# Patient Record
Sex: Female | Born: 1960 | Race: White | Hispanic: No | Marital: Single | Smoking: Current every day smoker
Health system: Southern US, Community
[De-identification: ages and names within clinical notes are randomized; demographics above are authoritative.]

## PROBLEM LIST (undated history)

## (undated) DIAGNOSIS — K5792 Diverticulitis of intestine, part unspecified, without perforation or abscess without bleeding: Secondary | ICD-10-CM

---

## 2008-07-29 ENCOUNTER — Emergency Department (HOSPITAL_COMMUNITY): Admission: EM | Admit: 2008-07-29 | Discharge: 2008-07-29 | Payer: Self-pay | Admitting: Emergency Medicine

## 2012-05-31 ENCOUNTER — Emergency Department: Payer: Self-pay | Admitting: Emergency Medicine

## 2012-05-31 LAB — CBC
HCT: 51.4 % — ABNORMAL HIGH
HGB: 16.6 g/dL — ABNORMAL HIGH
MCH: 27.5 pg
MCHC: 32.3 g/dL
MCV: 85 fL
Platelet: 479 x10 3/mm 3 — ABNORMAL HIGH
RBC: 6.03 X10 6/mm 3 — ABNORMAL HIGH
RDW: 14.8 % — ABNORMAL HIGH
WBC: 23.6 x10 3/mm 3 — ABNORMAL HIGH

## 2012-05-31 LAB — URINALYSIS, COMPLETE
Bilirubin,UR: NEGATIVE
Glucose,UR: NEGATIVE mg/dL
Nitrite: NEGATIVE
Ph: 5
Protein: 30
RBC,UR: 3 /HPF
Specific Gravity: 1.02
Squamous Epithelial: 22
WBC UR: 19 /HPF

## 2012-05-31 LAB — COMPREHENSIVE METABOLIC PANEL
Alkaline Phosphatase: 112 U/L (ref 50–136)
BUN: 9 mg/dL (ref 7–18)
Bilirubin,Total: 0.7 mg/dL (ref 0.2–1.0)
Calcium, Total: 9.2 mg/dL (ref 8.5–10.1)
Chloride: 106 mmol/L (ref 98–107)
EGFR (Non-African Amer.): >60
Potassium: 4.1 mmol/L (ref 3.5–5.1)
SGOT(AST): 20 U/L (ref 15–37)

## 2012-05-31 LAB — CK TOTAL AND CKMB (NOT AT ARMC)
CK, Total: 38 U/L
CK-MB: 0.7 ng/mL

## 2012-11-15 ENCOUNTER — Encounter (HOSPITAL_COMMUNITY): Payer: Self-pay | Admitting: *Deleted

## 2012-11-15 ENCOUNTER — Emergency Department (HOSPITAL_COMMUNITY): Payer: Self-pay

## 2012-11-15 ENCOUNTER — Emergency Department (HOSPITAL_COMMUNITY)
Admission: EM | Admit: 2012-11-15 | Discharge: 2012-11-15 | Disposition: A | Discharge status: Home / Self Care | Payer: Self-pay | Attending: Emergency Medicine | Admitting: Emergency Medicine

## 2012-11-15 DIAGNOSIS — R42 Dizziness and giddiness: Secondary | ICD-10-CM | POA: Insufficient documentation

## 2012-11-15 DIAGNOSIS — R112 Nausea with vomiting, unspecified: Secondary | ICD-10-CM | POA: Insufficient documentation

## 2012-11-15 DIAGNOSIS — F17200 Nicotine dependence, unspecified, uncomplicated: Secondary | ICD-10-CM | POA: Insufficient documentation

## 2012-11-15 DIAGNOSIS — R269 Unspecified abnormalities of gait and mobility: Secondary | ICD-10-CM | POA: Insufficient documentation

## 2012-11-15 HISTORY — DX: Diverticulitis of intestine, part unspecified, without perforation or abscess without bleeding: K57.92

## 2012-11-15 MED ORDER — LORAZEPAM 1 MG PO TABS: 0.5000 mg | ORAL_TABLET | Freq: Three times a day (TID) | ORAL | Status: DC | PRN

## 2012-11-15 MED ORDER — LORAZEPAM 1 MG PO TABS
1.0000 mg | ORAL_TABLET | Freq: Once | ORAL | Status: AC
  Administered 2012-11-15: 1 mg via ORAL
  Filled 2012-11-15: qty 1

## 2012-11-15 MED ORDER — MECLIZINE HCL 50 MG PO TABS: 25.0000 mg | ORAL_TABLET | Freq: Three times a day (TID) | ORAL | Status: DC | PRN

## 2012-11-15 NOTE — ED Notes (Signed)
Dizzy since Saturday with vomiting,  Nausea continues.  Feels better when lies down,No hx of head injury.

## 2012-11-15 NOTE — ED Provider Notes (Signed)
History   This chart was scribed for Teresa Hutching, MD by Sofie Rower, ED Scribe. The patient was seen in room APA07/APA07 and the patient's care was started at 4:50PM.     CSN: [id number]  Arrival date & time 11/15/12  1339   First MD Initiated Contact with Patient 11/15/12 1650      Chief Complaint  Patient presents with  . Dizziness    (Consider location/radiation/quality/duration/timing/severity/associated sxs/prior treatment) The history is provided by the patient and a friend. No language interpreter was used.    Teresa Glass is a 51 y.o. female , with a hx of diverticulitis, who presents to the Emergency Department complaining of sudden, progressively worsening dizziness, onset two days ago (11/13/12).  Associated symptoms include ataxic gait, nausea, and vomiting. The pt reports she has been dizzy and feeling as if she is intoxicated for the past two days, although she has been drinking plenty of fluids and staying hydrated. The pt has taken 2 doses of meclozine (one hour ago) which provides moderate relief of the dizziness. Modifying factors include certain movements and positions, specifically transition from the seated to standing position, which intensifies the dizziness.  The pt denies otalgia and sore throat.   The pt is a current everyday smoker, in addition to drinking alcohol.   Past Medical History  Diagnosis Date  . Diverticulitis     History reviewed. No pertinent past surgical history.  History reviewed. No pertinent family history.  History  Substance Use Topics  . Smoking status: Current Every Day Smoker  . Smokeless tobacco: Not on file  . Alcohol Use: Yes    OB History    Grav Para Term Preterm Abortions TAB SAB Ect Mult Living                  Review of Systems  HENT: Negative for ear pain and sore throat.   Gastrointestinal: Positive for nausea and vomiting.  Neurological: Positive for dizziness.  All other systems reviewed and are  negative.    Allergies  Review of patient's allergies indicates no known allergies.  Home Medications   Current Outpatient Rx  Name  Route  Sig  Dispense  Refill  . ACETAMINOPHEN 500 MG PO TABS   Oral   Take 500 mg by mouth every 6 (six) hours as needed. For pain         . IBUPROFEN 200 MG PO TABS   Oral   Take 200 mg by mouth every 6 (six) hours as needed. For pain           BP 128/94  Pulse 88  Temp 98.5 F (36.9 C) (Oral)  Resp 20  Ht 5\' 7"  (1.702 m)  Wt 160 lb (72.576 kg)  BMI 25.06 kg/m2  SpO2 97%  Physical Exam  Nursing note and vitals reviewed. Constitutional: She is oriented to person, place, and time. She appears well-developed and well-nourished.  HENT:  Head: Normocephalic and atraumatic.  Eyes: Conjunctivae normal and EOM are normal. Pupils are equal, round, and reactive to light.  Neck: Normal range of motion. Neck supple.  Cardiovascular: Normal rate, regular rhythm and normal heart sounds.   Pulmonary/Chest: Effort normal and breath sounds normal.  Abdominal: Soft. Bowel sounds are normal.  Musculoskeletal: Normal range of motion.  Neurological: She is alert and oriented to person, place, and time.  Skin: Skin is warm and dry.  Psychiatric: She has a normal mood and affect.    ED Course  Procedures (  including critical care time)  DIAGNOSTIC STUDIES: Oxygen Saturation is 97% on room air, normal by my interpretation.    COORDINATION OF CARE:  5:06 PM- Treatment plan concerning elimination of possible stroke, possibility of vertigo, and CT scan discussed with patient. Pt agrees with treatment.   8:50 PM- Recheck. Pt ambulated without gross ataxia. Treatment plan concerning discharge with meclozine and ativan discussed with patient. Pt agrees with treatment.        Labs Reviewed - No data to display  No results found for this or any previous visit. Ct Head Wo Contrast  11/15/2012  *RADIOLOGY REPORT*  Clinical Data: Dizziness  CT HEAD  WITHOUT CONTRAST  Technique:  Contiguous axial images were obtained from the base of the skull through the vertex without contrast.  Comparison: None  Findings: The brain has a normal appearance without evidence for hemorrhage, infarction, hydrocephalus, or mass lesion.  There is no extra axial fluid collection.  The skull and paranasal sinuses are normal.  IMPRESSION: No acute intracranial abnormalities.   Original Report Authenticated By: Signa Kell, M.D.       No diagnosis found.    MDM  History and physical consistent with vertigo.  Patient was ambulatory at discharge.  Discharge home on meclizine 25 mg #15 and Ativan 0.5 mg #10. She has been instructed return if worse      I personally performed the services described in this documentation, which was scribed in my presence. The recorded information has been reviewed and is accurate.    Teresa Hutching, MD 11/15/12 2100

## 2012-11-15 NOTE — ED Notes (Signed)
Walked patient short distance. The patient walked with an unsteady gate and complained of being dizzy.

## 2013-07-09 IMAGING — CR DG CHEST 2V
1 series · 2 of 2 positions shown · non-contrast
Comparison: none

REASON FOR EXAM: leukocytosis
COMMENTS:

PROCEDURE:     DXR - DXR CHEST PA (OR AP) AND LATERAL  - May 31, 2012  [DATE]
RESULT:     Lungs clear. Cardiovascular structures unremarkable.

[Series 1: pa · 0.17mm/px · 2 of 2 slices shown]
[im 1/2]
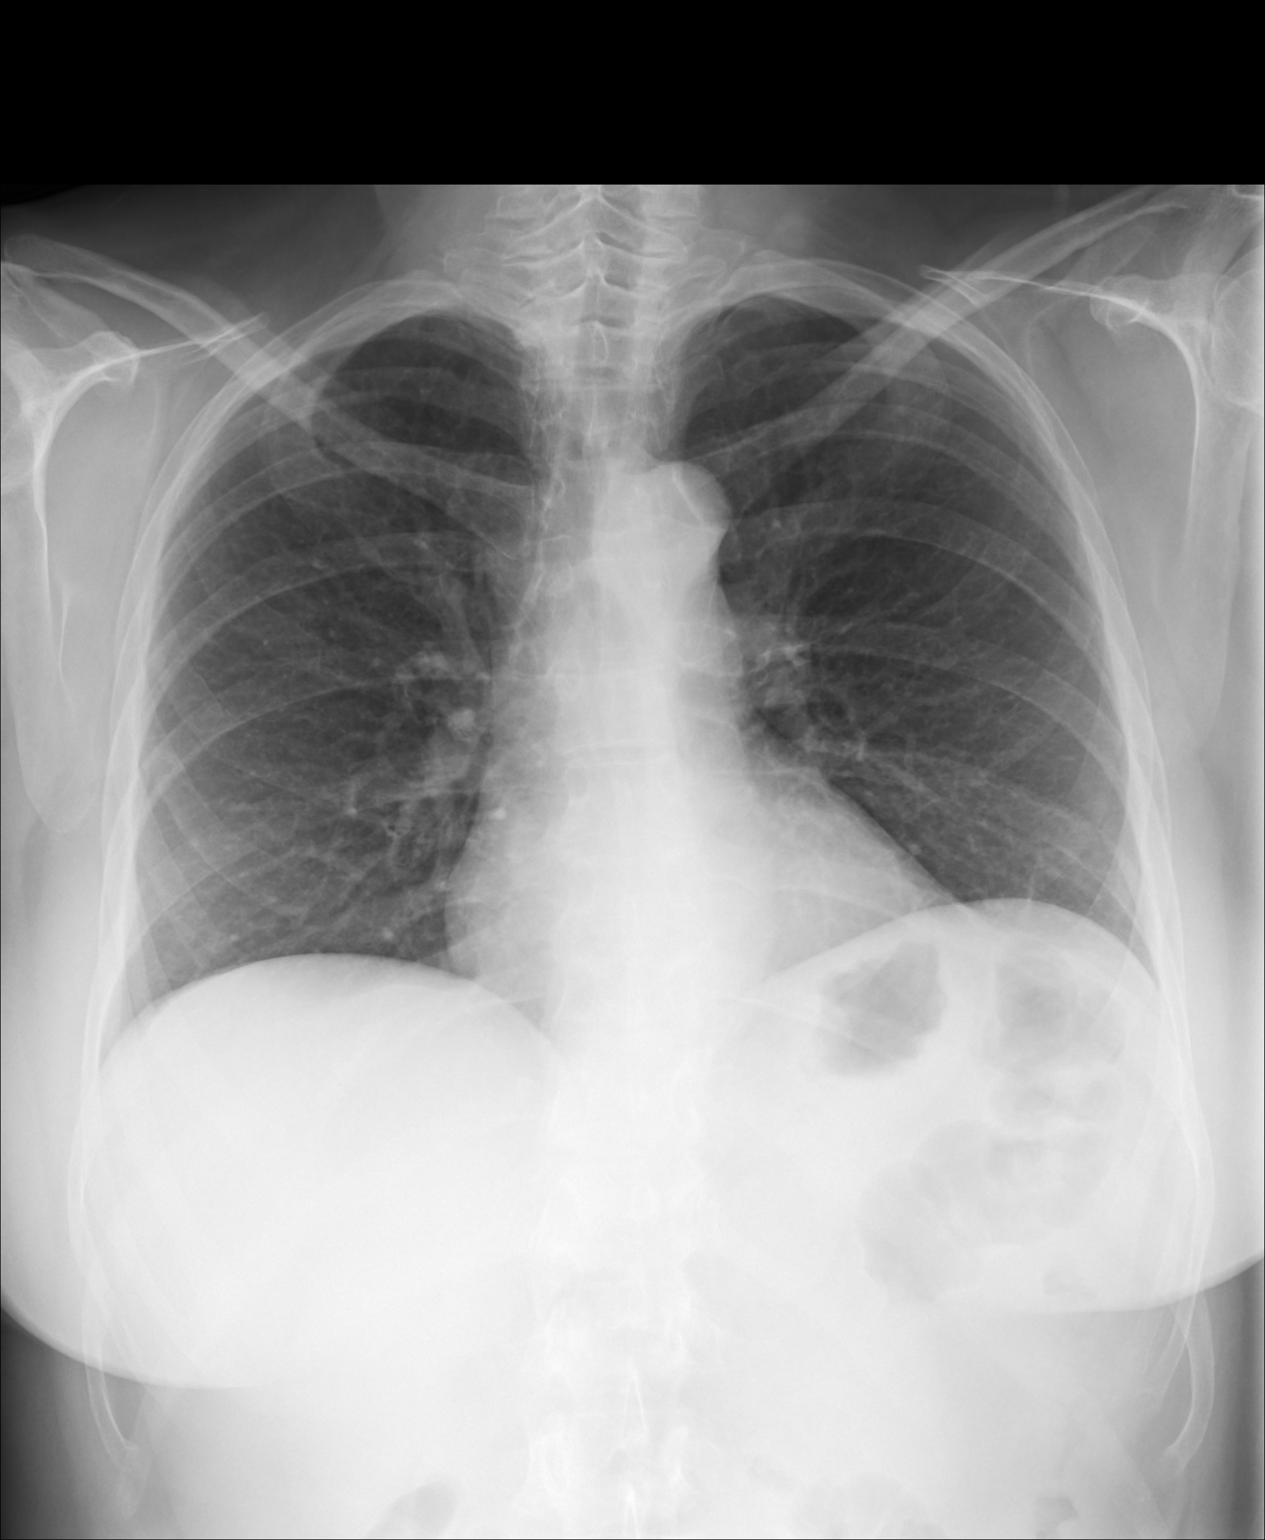
[im 2/2]
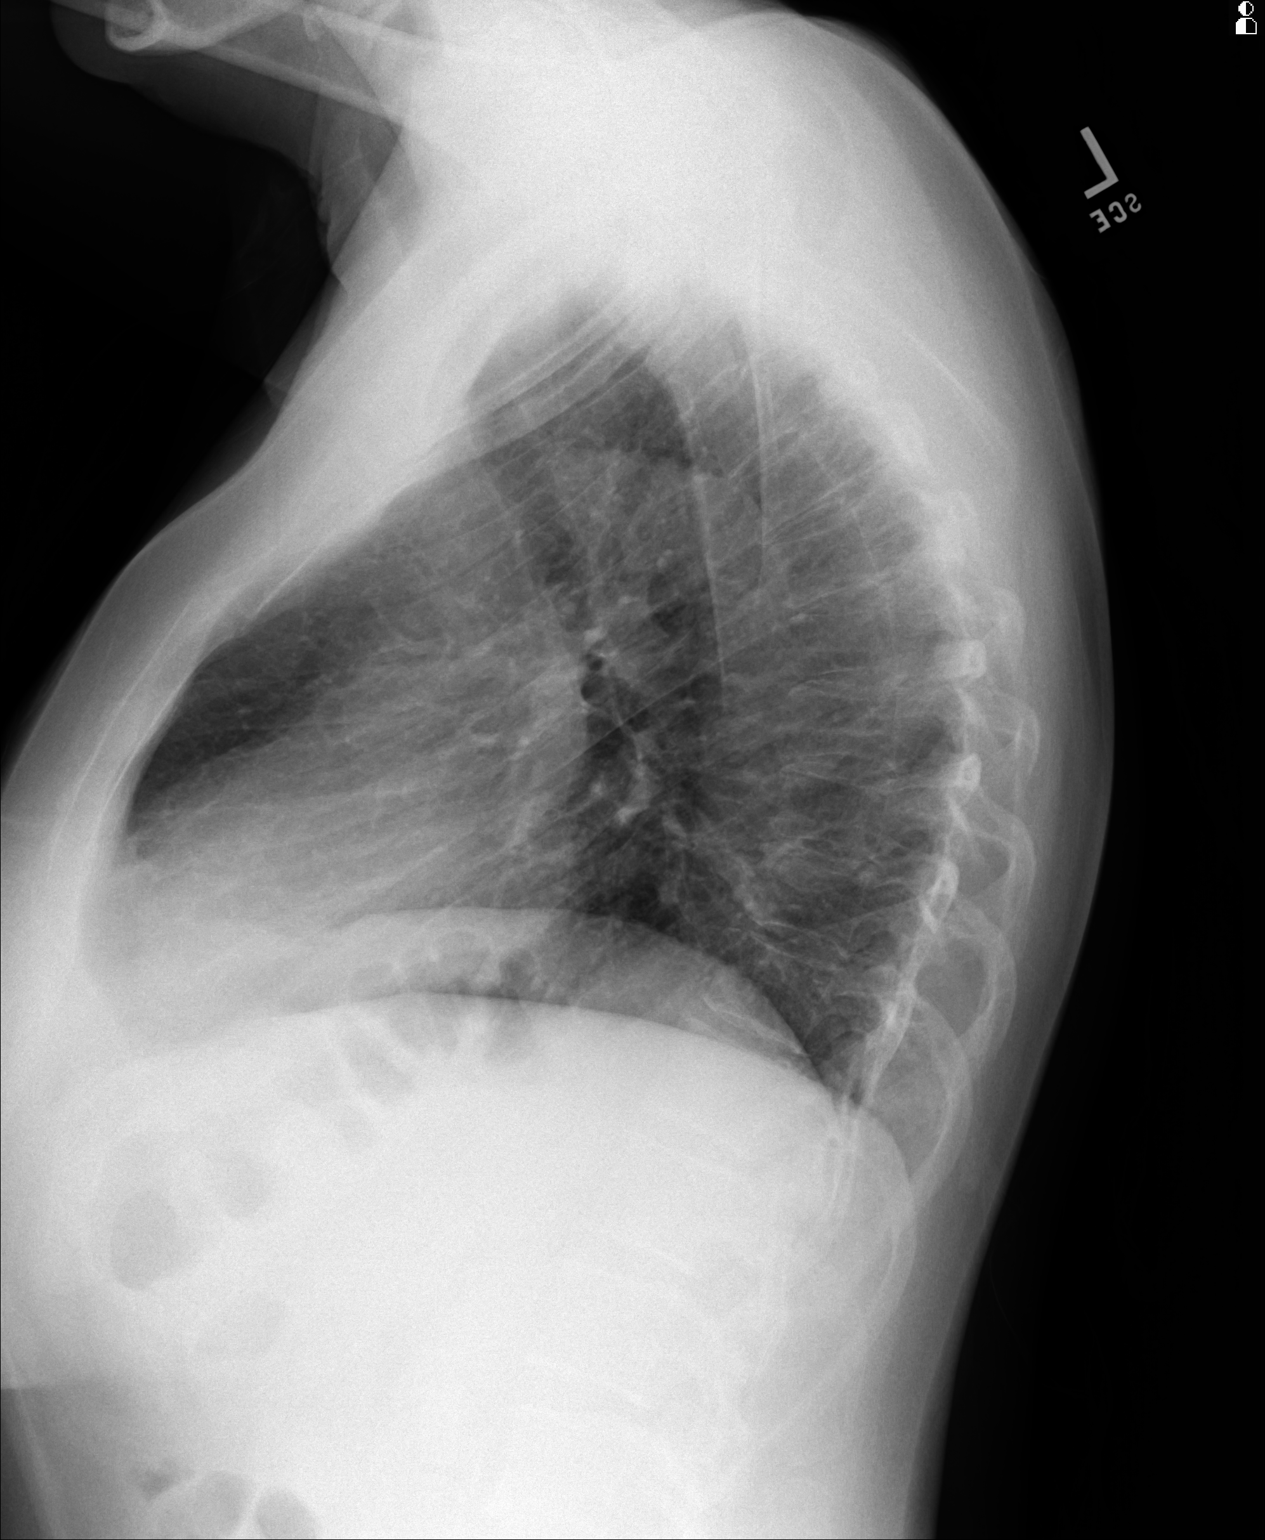

[2 of 2 positions shown; findings below may reference images not displayed]

IMPRESSION: No acute abnormality.

## 2015-08-24 ENCOUNTER — Emergency Department (HOSPITAL_COMMUNITY)
Admission: EM | Admit: 2015-08-24 | Discharge: 2015-08-24 | Disposition: A | Discharge status: Home / Self Care | Payer: Self-pay | Attending: Emergency Medicine | Admitting: Emergency Medicine

## 2015-08-24 ENCOUNTER — Encounter (HOSPITAL_COMMUNITY): Payer: Self-pay | Admitting: Emergency Medicine

## 2015-08-24 ENCOUNTER — Emergency Department (HOSPITAL_COMMUNITY): Payer: Self-pay

## 2015-08-24 DIAGNOSIS — Y9389 Activity, other specified: Secondary | ICD-10-CM | POA: Insufficient documentation

## 2015-08-24 DIAGNOSIS — S5012XA Contusion of left forearm, initial encounter: Secondary | ICD-10-CM | POA: Insufficient documentation

## 2015-08-24 DIAGNOSIS — Y998 Other external cause status: Secondary | ICD-10-CM | POA: Insufficient documentation

## 2015-08-24 DIAGNOSIS — Y9289 Other specified places as the place of occurrence of the external cause: Secondary | ICD-10-CM | POA: Insufficient documentation

## 2015-08-24 DIAGNOSIS — Z720 Tobacco use: Secondary | ICD-10-CM | POA: Insufficient documentation

## 2015-08-24 DIAGNOSIS — W010XXA Fall on same level from slipping, tripping and stumbling without subsequent striking against object, initial encounter: Secondary | ICD-10-CM | POA: Insufficient documentation

## 2015-08-24 DIAGNOSIS — Z8719 Personal history of other diseases of the digestive system: Secondary | ICD-10-CM | POA: Insufficient documentation

## 2015-08-24 MED ORDER — KETOROLAC TROMETHAMINE 10 MG PO TABS
10.0000 mg | ORAL_TABLET | Freq: Once | ORAL | Status: AC
  Administered 2015-08-24: 10 mg via ORAL
  Filled 2015-08-24: qty 1

## 2015-08-24 MED ORDER — ACETAMINOPHEN 500 MG PO TABS
500.0000 mg | ORAL_TABLET | Freq: Once | ORAL | Status: AC
  Administered 2015-08-24: 500 mg via ORAL
  Filled 2015-08-24: qty 1

## 2015-08-24 NOTE — Discharge Instructions (Signed)
Your x-ray is negative for fracture or dislocation. Please use the Ace wrap, sling, an ice pack until the soreness has subsided. Please use 800 mg of ibuprofen, and 500 mg of Tylenol 3 times daily with a meal for soreness and discomfort. Please see the orthopedist listed above, or the orthopedist of your choice if not improving.

## 2015-08-24 NOTE — ED Provider Notes (Signed)
CSN: [id number]     Arrival date & time 08/24/15  1642 History   First MD Initiated Contact with Patient 08/24/15 1939     Chief Complaint  Patient presents with  . Arm Pain     (Consider location/radiation/quality/duration/timing/severity/associated sxs/prior Treatment) HPI Comments: Patient is a 54 year old female who presents to the emergency department with a complaint of left arm and forearm area pain.  The patient states that she slipped and fell on last evening injuring the left arm and forearm area. She states that she caught most of her weight on this arm. She denies injuring the shoulder or elbow area. But she complains of forearm and arm area pain. There was no loss of consciousness reported. No other injury reported at this time. The patient denies being on any anticoagulation medications. She's not had any previous operations or procedures involving the left upper extremity. She has tried Tylenol without improvement of the discomfort.  Patient is a 54 y.o. female presenting with arm pain. The history is provided by the patient.  Arm Pain This is a new problem. The current episode started yesterday. Associated symptoms include arthralgias.    Past Medical History  Diagnosis Date  . Diverticulitis    History reviewed. No pertinent past surgical history. Family History  Problem Relation Age of Onset  . Cancer Father    Social History  Substance Use Topics  . Smoking status: Current Every Day Smoker -- 1.00 packs/day for 40 years    Types: Cigarettes  . Smokeless tobacco: Never Used  . Alcohol Use: No   OB History    Gravida Para Term Preterm AB TAB SAB Ectopic Multiple Living   Review of Systems  Musculoskeletal: Positive for arthralgias.  All other systems reviewed and are negative.     Allergies  Review of patient's allergies indicates no known allergies.  Home Medications   Prior to Admission medications   Medication Sig Start Date  End Date Taking? Authorizing Provider  acetaminophen (TYLENOL) 500 MG tablet Take 500 mg by mouth every 6 (six) hours as needed. For pain    Historical Provider, MD  ibuprofen (ADVIL,MOTRIN) 200 MG tablet Take 200 mg by mouth every 6 (six) hours as needed. For pain    Historical Provider, MD  LORazepam (ATIVAN) 1 MG tablet Take 0.5 tablets (0.5 mg total) by mouth 3 (three) times daily as needed for anxiety. Patient not taking: Reported on 08/24/2015 11/15/12   Donnetta Hutching, MD  meclizine (ANTIVERT) 50 MG tablet Take 0.5 tablets (25 mg total) by mouth 3 (three) times daily as needed. Patient not taking: Reported on 08/24/2015 11/15/12   Donnetta Hutching, MD   BP 123/81 mmHg  Pulse 99  Temp(Src) 97.7 F (36.5 C) (Oral)  Resp 16  Ht  (1.702 m)  Wt 160 lb (72.576 kg)  BMI 25.05 kg/m2  SpO2 99% Physical Exam  Constitutional: She is oriented to person, place, and time. She appears well-developed and well-nourished.  Non-toxic appearance.  HENT:  Head: Normocephalic.  Right Ear: Tympanic membrane and external ear normal.  Left Ear: Tympanic membrane and external ear normal.  Eyes: EOM and lids are normal. Pupils are equal, round, and reactive to light.  Neck: Normal range of motion. Neck supple. Carotid bruit is not present.  Cardiovascular: Normal rate, regular rhythm, normal heart sounds, intact distal pulses and normal pulses.   Pulmonary/Chest: Breath sounds normal. No respiratory  distress.  Abdominal: Soft. Bowel sounds are normal. There is no tenderness. There is no guarding.  Musculoskeletal: Normal range of motion.       Left forearm: She exhibits tenderness. She exhibits no deformity and no laceration.  There is a small bruise to the corner surface of the left forearm. There is tenderness to the palmar surface of the left forearm. There is pain with range of motion of the left wrist. There is full range of motion of all fingers. Capillary refill is less than 2 seconds. Radial pulses 2+. There  is full range of motion of the left elbow and shoulder. There is full range of motion of the right upper extremity without problem.  Lymphadenopathy:       Head (right side): No submandibular adenopathy present.       Head (left side): No submandibular adenopathy present.    She has no cervical adenopathy.  Neurological: She is alert and oriented to person, place, and time. She has normal strength. No cranial nerve deficit or sensory deficit.  Skin: Skin is warm and dry.  Psychiatric: She has a normal mood and affect. Her speech is normal.  Nursing note and vitals reviewed.   ED Course  Procedures (including critical care time) Labs Review Labs Reviewed - No data to display  Imaging Review Dg Forearm Left  08/24/2015   CLINICAL DATA:  Midshaft forearm pain, tripped and fell yesterday  EXAM: LEFT FOREARM - 2 VIEW  COMPARISON:  None.  FINDINGS: There is no evidence of fracture or other focal bone lesions. Soft tissues are unremarkable.  IMPRESSION: Negative.   Electronically Signed   By: Christiana Pellant M.D.   On: 08/24/2015 18:08   I have personally reviewed and evaluated these images and lab results as part of my medical decision-making.   EKG Interpretation None      MDM  Patient sustained a fall on last night. She continues to have pain and swelling of the left forearm. X-ray of the left forearm is negative for fracture or dislocation. The patient is fitted with a Ace wrap and sling. A prescription for ibuprofen 800 mg given with the instructions that the patient should take 800 mg of ibuprofen and 500 mg of Tylenol 3 times daily for soreness. An ice pack is been provided. Patient will follow-up with orthopedics if not improving.    Final diagnoses:  None    *I have reviewed nursing notes, vital signs, and all appropriate lab and imaging results for this patient.**    Ivery Quale, PA-C 08/24/15 2011  Vanetta Mulders, MD 08/25/15 1714

## 2015-08-24 NOTE — ED Notes (Signed)
Patient c/o left forearm pain. Per patient slipped and fell last night and tried to caught self with that arm. Denies hitting head or LOC. Swelling noted, no obvious deformity.

## 2016-10-01 IMAGING — DX DG FOREARM 2V*L*
2 series · 2 of 2 positions shown · non-contrast
Comparison: None.

CLINICAL DATA: Midshaft forearm pain, tripped and fell yesterday

EXAM:
LEFT FOREARM - 2 VIEW

[forearm ap]
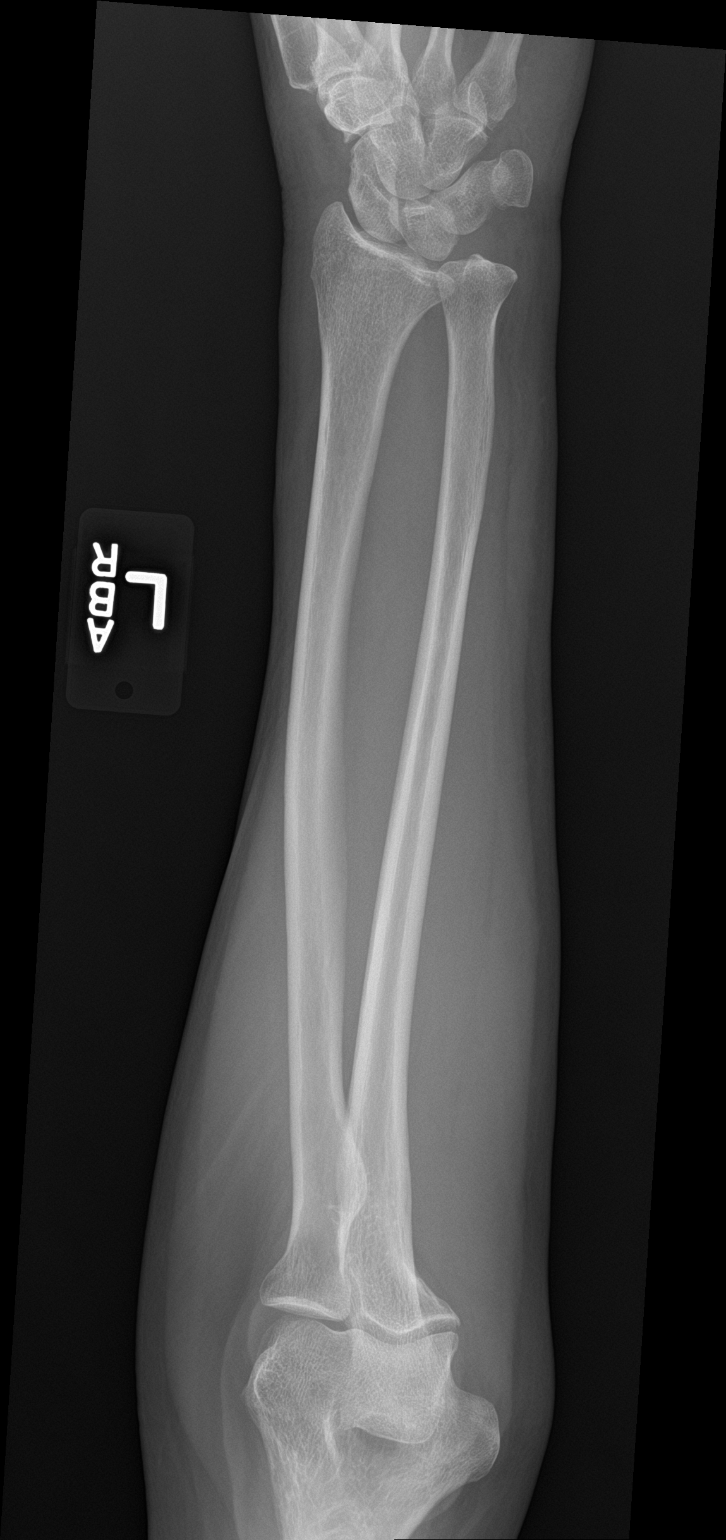

[forearm lat]
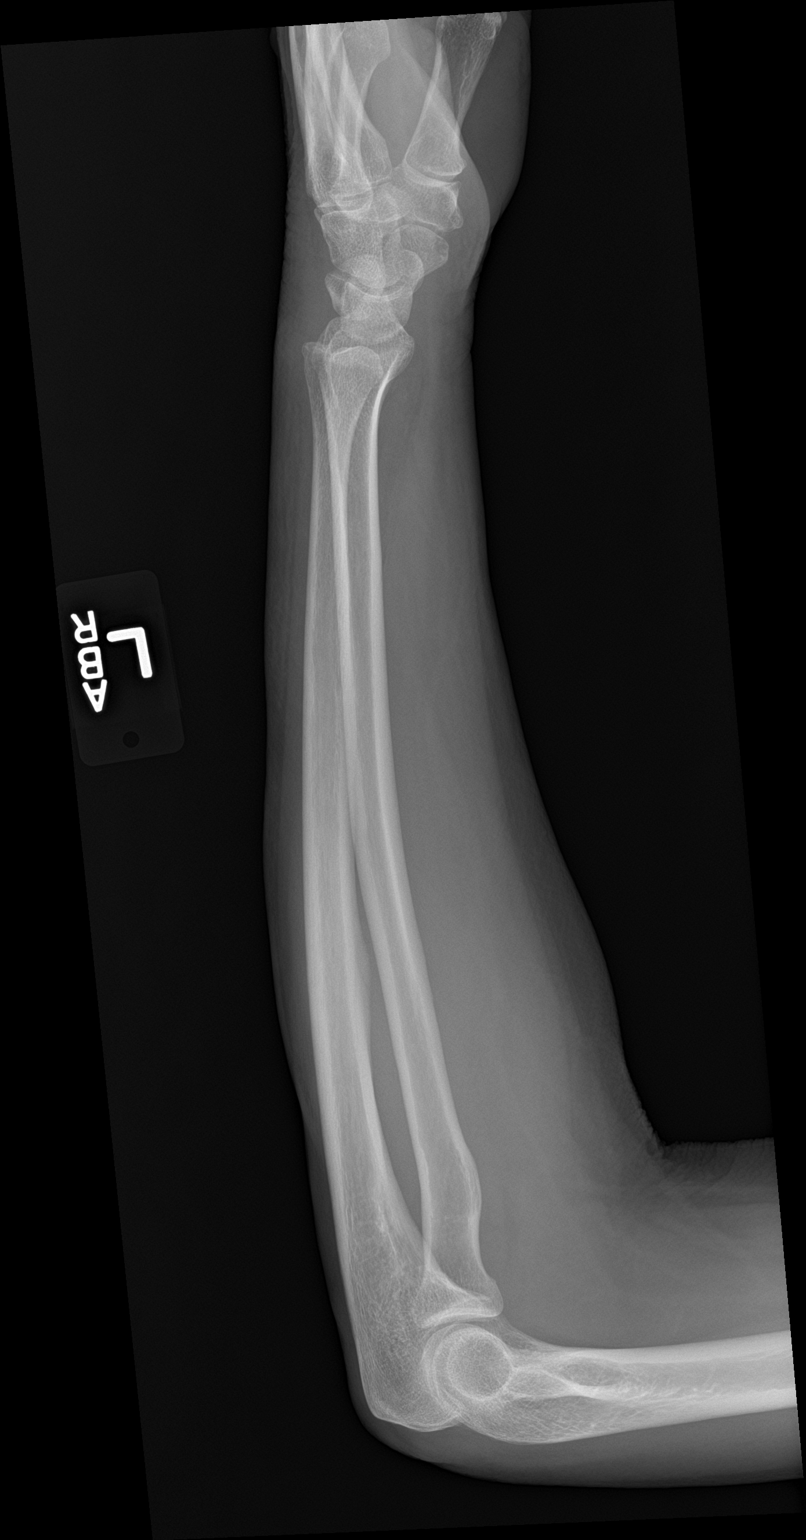

[2 of 2 positions shown; findings below may reference images not displayed]

FINDINGS: There is no evidence of fracture or other focal bone lesions. Soft
tissues are unremarkable.
IMPRESSION: Negative.

## 2019-04-15 ENCOUNTER — Emergency Department: Payer: BLUE CROSS/BLUE SHIELD

## 2019-04-15 ENCOUNTER — Other Ambulatory Visit: Payer: Self-pay

## 2019-04-15 ENCOUNTER — Encounter: Payer: Self-pay | Admitting: Emergency Medicine

## 2019-04-15 ENCOUNTER — Observation Stay
Admission: EM | Admit: 2019-04-15 | Discharge: 2019-04-16 | Disposition: A | Discharge status: Home / Self Care | Payer: BLUE CROSS/BLUE SHIELD | Attending: Specialist | Admitting: Specialist

## 2019-04-15 DIAGNOSIS — Z7982 Long term (current) use of aspirin: Secondary | ICD-10-CM | POA: Diagnosis not present

## 2019-04-15 DIAGNOSIS — R7989 Other specified abnormal findings of blood chemistry: Secondary | ICD-10-CM | POA: Diagnosis present

## 2019-04-15 DIAGNOSIS — E785 Hyperlipidemia, unspecified: Secondary | ICD-10-CM | POA: Insufficient documentation

## 2019-04-15 DIAGNOSIS — Z9119 Patient's noncompliance with other medical treatment and regimen: Secondary | ICD-10-CM | POA: Diagnosis not present

## 2019-04-15 DIAGNOSIS — I10 Essential (primary) hypertension: Secondary | ICD-10-CM | POA: Diagnosis present

## 2019-04-15 DIAGNOSIS — Z791 Long term (current) use of non-steroidal anti-inflammatories (NSAID): Secondary | ICD-10-CM | POA: Insufficient documentation

## 2019-04-15 DIAGNOSIS — R778 Other specified abnormalities of plasma proteins: Secondary | ICD-10-CM

## 2019-04-15 DIAGNOSIS — F17210 Nicotine dependence, cigarettes, uncomplicated: Secondary | ICD-10-CM | POA: Insufficient documentation

## 2019-04-15 DIAGNOSIS — R0789 Other chest pain: Secondary | ICD-10-CM | POA: Diagnosis not present

## 2019-04-15 DIAGNOSIS — Z79899 Other long term (current) drug therapy: Secondary | ICD-10-CM | POA: Insufficient documentation

## 2019-04-15 LAB — BASIC METABOLIC PANEL
Anion gap: 9 (ref 5–15)
BUN: 15 mg/dL (ref 6–20)
CO2: 25 mmol/L (ref 22–32)
Calcium: 9.1 mg/dL (ref 8.9–10.3)
Chloride: 105 mmol/L (ref 98–111)
Creatinine, Ser: 0.72 mg/dL (ref 0.44–1.00)
GFR calc Af Amer: >60 mL/min (ref >60)
GFR calc non Af Amer: >60 mL/min (ref >60)
Glucose, Bld: 97 mg/dL (ref 70–99)
Potassium: 4 mmol/L (ref 3.5–5.1)
Sodium: 139 mmol/L (ref 135–145)

## 2019-04-15 LAB — CBC
HCT: 44.4 % (ref 36.0–46.0)
HCT: 48 % — ABNORMAL HIGH (ref 36.0–46.0)
Hemoglobin: 14.5 g/dL (ref 12.0–15.0)
Hemoglobin: 15.4 g/dL — ABNORMAL HIGH (ref 12.0–15.0)
MCH: 27.1 pg (ref 26.0–34.0)
MCH: 27.2 pg (ref 26.0–34.0)
MCHC: 32.1 g/dL (ref 30.0–36.0)
MCHC: 32.7 g/dL (ref 30.0–36.0)
MCV: 83 fL (ref 80.0–100.0)
MCV: 84.8 fL (ref 80.0–100.0)
Platelets: 468 10*3/uL — ABNORMAL HIGH (ref 150–400)
Platelets: 491 10*3/uL — ABNORMAL HIGH (ref 150–400)
RBC: 5.35 MIL/uL — ABNORMAL HIGH (ref 3.87–5.11)
RBC: 5.66 MIL/uL — ABNORMAL HIGH (ref 3.87–5.11)
RDW: 14.4 % (ref 11.5–15.5)
RDW: 14.5 % (ref 11.5–15.5)
WBC: 13 10*3/uL — ABNORMAL HIGH (ref 4.0–10.5)
WBC: 13.2 10*3/uL — ABNORMAL HIGH (ref 4.0–10.5)
nRBC: 0 % (ref 0.0–0.2)
nRBC: 0 % (ref 0.0–0.2)

## 2019-04-15 LAB — LIPID PANEL
Cholesterol: 216 mg/dL — ABNORMAL HIGH (ref 0–200)
HDL: 46 mg/dL (ref >40)
LDL Cholesterol: 138 mg/dL — ABNORMAL HIGH (ref 0–99)
Total CHOL/HDL Ratio: 4.7 RATIO
Triglycerides: 159 mg/dL — ABNORMAL HIGH (ref <150)
VLDL: 32 mg/dL (ref 0–40)

## 2019-04-15 LAB — TROPONIN I
Troponin I: 0.06 ng/mL (ref <0.03)
Troponin I: 0.06 ng/mL (ref <0.03)

## 2019-04-15 MED ORDER — NITROGLYCERIN 0.4 MG SL SUBL
SUBLINGUAL_TABLET | SUBLINGUAL | Status: AC
  Filled 2019-04-15: qty 1

## 2019-04-15 MED ORDER — METOPROLOL TARTRATE 25 MG PO TABS
25.0000 mg | ORAL_TABLET | ORAL | Status: AC
Start: 2019-04-15 — End: 2019-04-15
  Administered 2019-04-15: 17:00:00 25 mg via ORAL
  Filled 2019-04-15: qty 1

## 2019-04-15 MED ORDER — NITROGLYCERIN 0.4 MG SL SUBL
0.4000 mg | SUBLINGUAL_TABLET | SUBLINGUAL | Status: AC | PRN
  Administered 2019-04-15 – 2019-04-16 (×3): 0.4 mg via SUBLINGUAL
  Filled 2019-04-15: qty 1

## 2019-04-15 MED ORDER — ASPIRIN 81 MG PO CHEW
81.0000 mg | CHEWABLE_TABLET | Freq: Every day | ORAL | Status: DC
  Administered 2019-04-16: 12:00:00 81 mg via ORAL
  Filled 2019-04-15: qty 1

## 2019-04-15 MED ORDER — ENOXAPARIN SODIUM 40 MG/0.4ML ~~LOC~~ SOLN
40.0000 mg | SUBCUTANEOUS | Status: DC
  Administered 2019-04-15: 40 mg via SUBCUTANEOUS
  Filled 2019-04-15: qty 0.4

## 2019-04-15 MED ORDER — IOHEXOL 350 MG/ML SOLN
75.0000 mL | Freq: Once | INTRAVENOUS | Status: AC | PRN
  Administered 2019-04-15: 16:00:00 75 mL via INTRAVENOUS

## 2019-04-15 MED ORDER — METOPROLOL TARTRATE 25 MG PO TABS
25.0000 mg | ORAL_TABLET | Freq: Two times a day (BID) | ORAL | Status: DC
  Administered 2019-04-16: 25 mg via ORAL
  Filled 2019-04-15 (×2): qty 1

## 2019-04-15 MED ORDER — ACETAMINOPHEN 325 MG PO TABS: 650.0000 mg | ORAL_TABLET | ORAL | Status: DC | PRN

## 2019-04-15 MED ORDER — HYDRALAZINE HCL 20 MG/ML IJ SOLN
10.0000 mg | INTRAMUSCULAR | Status: DC | PRN
  Administered 2019-04-15: 21:00:00 10 mg via INTRAVENOUS
  Filled 2019-04-15: qty 1

## 2019-04-15 MED ORDER — ONDANSETRON HCL 4 MG/2ML IJ SOLN: 4.0000 mg | Freq: Four times a day (QID) | INTRAMUSCULAR | Status: DC | PRN

## 2019-04-15 NOTE — ED Notes (Signed)
Troponin 0.06 called to [employer]

## 2019-04-15 NOTE — Progress Notes (Addendum)
Pt BP at 166/107 HR 64. NO PRN mediciness to control BP. Notify [facility] and talked top Dr. Anne Hahn and states will place order. Will continue to monitor.  Update 2224: Pt complained of chest pain 7 out of 10 pain after eating dinner. No complaints of nausea and diaphoresis. Emergency adult standing order initiated. Pt was place on 2 liters oxygen, EKG STAT, and nitro sublingual given. Pt pain went down to zero after giving the second dsoe of 0.4 mg nitro sublingual. Talked to Dr. Anne Hahn and states to just go with the standing order at this time. Will continue to monitor.   Update 2253: Notified Dr. Anne Hahn that pt EKG resulted and pt chest pain went down to zero. Will continue to monitor.  Update 0244: Pt complaints of CP 6 out 10. Pt was given nitro SL x 3 but CP still at 2. Notify [facility] and talked to DR. Diamond and states will place order. Will continue to monitor.  Update 0659: Pt complaints of 8 out 10 pain. After nitro SL x2 pt pain went down to 0. Will continue to monitor.

## 2019-04-15 NOTE — Plan of Care (Signed)
  Problem: Education: Goal: Knowledge of General Education information will improve Description: Including pain rating scale, medication(s)/side effects and non-pharmacologic comfort measures Outcome: Progressing   Problem: Pain Managment: Goal: General experience of comfort will improve Outcome: Progressing   Problem: Safety: Goal: Ability to remain free from injury will improve Outcome: Progressing   

## 2019-04-15 NOTE — ED Triage Notes (Signed)
Pt with chest pain that started 2 days ago. Pain is left of center, sharp, constant, increases with sitting up and inhalation. Pt took 4 baby aspirin this am.

## 2019-04-15 NOTE — ED Provider Notes (Signed)
[facility] Emergency Department Provider Note   ____________________________________________   First MD Initiated Contact with Patient 04/15/19 1507     (approximate)  I have reviewed the triage vital signs and the nursing notes.   HISTORY  Chief Complaint Chest Pain    HPI Antionette CharMary J Raphael is a 58 y.o. female here for evaluation of chest pain  Patient reports this morning when she woke up she started noticing a pain that sharp over the left side of her chest.  No associated fevers or chills.  No nausea or vomiting.  No body aches.  Has not been around anyone known to have coronavirus.  She reports the pain is rather sharp, seems to be worse when she sits up.  She is concerned this is right in the area of her heart over the left side.  Does not radiate.  Denies history of heart disease.  No abdominal pain no vomiting.  She took 4 baby aspirin prior to arrival, and reports the pain is been gone since   Past Medical History:  Diagnosis Date  . Diverticulitis     There are no active problems to display for this patient.   History reviewed. No pertinent surgical history.  Prior to Admission medications   Medication Sig Start Date End Date Taking? Authorizing Provider  aspirin 81 MG chewable tablet Chew 81 mg by mouth daily.   Yes [provider]  acetaminophen (TYLENOL) 500 MG tablet Take 500 mg by mouth every 6 (six) hours as needed. For pain    [provider]  ibuprofen (ADVIL,MOTRIN) 200 MG tablet Take 200 mg by mouth every 6 (six) hours as needed. For pain    [provider]  LORazepam (ATIVAN) 1 MG tablet Take 0.5 tablets (0.5 mg total) by mouth 3 (three) times daily as needed for anxiety. Patient not taking: Reported on 08/24/2015 11/15/12   Donnetta Hutchingook, Brian, MD  meclizine (ANTIVERT) 50 MG tablet Take 0.5 tablets (25 mg total) by mouth 3 (three) times daily as needed. Patient not taking: Reported on 08/24/2015 11/15/12   Donnetta Hutchingook,  Brian, MD    Allergies Patient has no known allergies.  Family History  Problem Relation Age of Onset  . Cancer Father     Social History Social History   Tobacco Use  . Smoking status: Current Every Day Smoker    Packs/day: 1.00    Years: 40.00    Pack years: 40.00    Types: Cigarettes  . Smokeless tobacco: Never Used  Substance Use Topics  . Alcohol use: No  . Drug use: No    Review of Systems Constitutional: No fever/chills Eyes: No visual changes. ENT: No sore throat. Cardiovascular: See HPI  respiratory: Denies shortness of breath.  No cough. Gastrointestinal: No abdominal pain.   Genitourinary: Negative for dysuria. Musculoskeletal: Negative for back pain. Skin: Negative for rash. Neurological: Negative for headaches, areas of focal weakness or numbness.    ____________________________________________   PHYSICAL EXAM:  VITAL SIGNS: ED Triage Vitals  Enc Vitals Group     BP 04/15/19 1352 (!) 155/84     Pulse Rate 04/15/19 1352 80     Resp 04/15/19 1352 18     Temp 04/15/19 1352 98.3 F (36.8 C)     Temp Source 04/15/19 1352 Oral     SpO2 04/15/19 1352 96 %     Weight 04/15/19 1352 168 lb (76.2 kg)     Height 04/15/19 1352 5\' 7"  (1.702 m)  Head Circumference --      Peak Flow --      Pain Score 04/15/19 1451 0     Pain Loc --      Pain Edu? --      Excl. in GC? --     Constitutional: Alert and oriented. Well appearing and in no acute distress. Eyes: Conjunctivae are normal. Head: Atraumatic. Nose: No congestion/rhinnorhea. Mouth/Throat: Mucous membranes are moist. Neck: No stridor.  Cardiovascular: Normal rate, regular rhythm. Grossly normal heart sounds.  Good peripheral circulation. Respiratory: Normal respiratory effort.  No retractions. Lungs CTAB. Gastrointestinal: Soft and nontender. No distention. Musculoskeletal: No lower extremity tenderness nor edema. Neurologic:  Normal speech and language. No gross focal neurologic deficits  are appreciated.  Skin:  Skin is warm, dry and intact. No rash noted. Psychiatric: Mood and affect are normal. Speech and behavior are normal.  ____________________________________________   LABS (all labs ordered are listed, but only abnormal results are displayed)  Labs Reviewed  CBC - Abnormal; Notable for the following components:      Result Value   WBC 13.0 (*)    RBC 5.66 (*)    Hemoglobin 15.4 (*)    HCT 48.0 (*)    Platelets 491 (*)    All other components within normal limits  TROPONIN I - Abnormal; Notable for the following components:   Troponin I 0.06 (*)    All other components within normal limits  BASIC METABOLIC PANEL   ____________________________________________  EKG  Reviewed interpreted me at 1250 Heart rate 80 QRS 100 QTc 480 Normal sinus rhythm, left ventricular hypertrophy, slight prolonged QT Nonspecific T wave banality lead I and aVL, may be related to LVH, also could consider possible ischemic change ____________________________________________  RADIOLOGY  Dg Chest 2 View  Result Date: 04/15/2019 CLINICAL DATA:  Chest pain EXAM: CHEST - 2 VIEW COMPARISON:  05/31/2012 FINDINGS: The heart size and mediastinal contours are within normal limits. Both lungs are clear. The visualized skeletal structures are unremarkable. IMPRESSION: No active cardiopulmonary disease. Electronically Signed   By: Deatra Robinson M.D.   On: 04/15/2019 15:22   Ct Angio Chest Pe W And/or Wo Contrast  Result Date: 04/15/2019 CLINICAL DATA:  Left-sided chest and breast pain with shortness of breath. EXAM: CT ANGIOGRAPHY CHEST WITH CONTRAST TECHNIQUE: Multidetector CT imaging of the chest was performed using the standard protocol during bolus administration of intravenous contrast. Multiplanar CT image reconstructions and MIPs were obtained to evaluate the vascular anatomy. CONTRAST:  65mL OMNIPAQUE IOHEXOL 350 MG/ML SOLN COMPARISON:  CT of the chest on 07/29/2008 FINDINGS:  Cardiovascular: The pulmonary arteries are well opacified. There is no evidence of pulmonary embolism. Central pulmonary arteries are normal in caliber. The thoracic aorta is also well opacified. No evidence of thoracic aortic aneurysmal disease or dissection. No significant atherosclerosis. 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 The heart size is normal. No pericardial fluid identified. No significant calcified coronary artery plaque identified. Mediastinum/Nodes: No enlarged mediastinal, hilar, or axillary lymph nodes. Thyroid gland, trachea, and esophagus demonstrate no significant findings. Lungs/Pleura: Bibasilar atelectasis, right greater than left. There is no evidence of pulmonary edema, consolidation, pneumothorax, nodule or pleural fluid. Upper Abdomen: No acute abnormality. Musculoskeletal: No chest wall abnormality. No acute or significant osseous findings. Review of the MIP images confirms the above findings. IMPRESSION: 1. No evidence of pulmonary embolism or other acute findings in the chest. 2. 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 Electronically Signed   By: Irish Lack M.D.   On: 04/15/2019 16:01   CT scan reviewed negative for PE. ____________________________________________   PROCEDURES  Procedure(s) performed: None  Procedures  Critical Care performed: No  ____________________________________________   INITIAL IMPRESSION / ASSESSMENT AND PLAN / ED COURSE  Pertinent labs & imaging results that were available during my care of the patient were reviewed by me and considered in my medical decision making (see chart for details).   Differential diagnosis includes, but is not limited to, ACS, aortic dissection, pulmonary embolism, cardiac tamponade, pneumothorax, pneumonia, pericarditis, myocarditis, GI-related causes including  esophagitis/gastritis, and musculoskeletal chest wall pain.    Patient currently pain-free after taking aspirin.  She is resting comfortably.  Her symptoms somewhat very atypical of ACS, and she does not have any obvious risk factors other than age and blood pressure.  However, her troponin is elevated.  This could represent inflammatory, demand, ACS, or another host of etiologies.    ----------------------------------------- 5:27 PM on 04/15/2019 -----------------------------------------  Patient remains pain-free.  Because of her elevated troponin and notable hypertension, discussed with the patient will admit her for further work-up.  Etiology of her elevated troponin is not yet clear, she lacks infectious symptoms, has a reassuring chest CT, but does demonstrate an atypical chest pain in the setting of her elevated troponin will bring her in for further work-up under the hospitalist service.  Discussed with Dr. Allena Katz  ____________________________________________   FINAL CLINICAL IMPRESSION(S) / ED DIAGNOSES  Final diagnoses:  Atypical chest pain  Elevated troponin  Hypertension, unspecified type        Note:  This document was prepared using Dragon voice recognition software and may include unintentional dictation errors       Sharyn Creamer, MD 04/15/19 1727

## 2019-04-16 ENCOUNTER — Observation Stay (HOSPITAL_BASED_OUTPATIENT_CLINIC_OR_DEPARTMENT_OTHER): Payer: BLUE CROSS/BLUE SHIELD

## 2019-04-16 DIAGNOSIS — R7989 Other specified abnormal findings of blood chemistry: Secondary | ICD-10-CM | POA: Diagnosis not present

## 2019-04-16 DIAGNOSIS — R778 Other specified abnormalities of plasma proteins: Secondary | ICD-10-CM

## 2019-04-16 DIAGNOSIS — I10 Essential (primary) hypertension: Secondary | ICD-10-CM

## 2019-04-16 DIAGNOSIS — R0789 Other chest pain: Secondary | ICD-10-CM

## 2019-04-16 DIAGNOSIS — R079 Chest pain, unspecified: Secondary | ICD-10-CM

## 2019-04-16 LAB — NM MYOCAR MULTI W/SPECT W/WALL MOTION / EF
Estimated workload: 6.1 METS
Exercise duration (min): 5 min
LV dias vol: 78 mL (ref 46–106)
LV sys vol: 29 mL
Peak HR: 107 {beats}/min
Rest HR: 65 {beats}/min
SDS: 0
SRS: 10
SSS: 5
TID: 1

## 2019-04-16 LAB — CREATININE, SERUM
Creatinine, Ser: 0.7 mg/dL (ref 0.44–1.00)
GFR calc Af Amer: >60 mL/min (ref >60)
GFR calc non Af Amer: >60 mL/min (ref >60)

## 2019-04-16 LAB — TROPONIN I
Troponin I: 0.05 ng/mL (ref <0.03)
Troponin I: 0.06 ng/mL (ref <0.03)

## 2019-04-16 MED ORDER — LOSARTAN POTASSIUM 50 MG PO TABS: 50.0000 mg | ORAL_TABLET | Freq: Every day | ORAL | 1 refills | Status: DC

## 2019-04-16 MED ORDER — MORPHINE SULFATE (PF) 2 MG/ML IV SOLN
2.0000 mg | INTRAVENOUS | Status: DC | PRN
  Administered 2019-04-16: 03:00:00 2 mg via INTRAVENOUS
  Filled 2019-04-16: qty 1

## 2019-04-16 MED ORDER — LISINOPRIL 5 MG PO TABS: 5.0000 mg | ORAL_TABLET | Freq: Every day | ORAL | 1 refills | Status: DC

## 2019-04-16 MED ORDER — NITROGLYCERIN 0.4 MG SL SUBL
0.4000 mg | SUBLINGUAL_TABLET | SUBLINGUAL | Status: DC | PRN
  Administered 2019-04-16 (×5): 0.4 mg via SUBLINGUAL
  Filled 2019-04-16 (×2): qty 1

## 2019-04-16 MED ORDER — HYDROCHLOROTHIAZIDE 12.5 MG PO CAPS
12.5000 mg | ORAL_CAPSULE | Freq: Every day | ORAL | Status: DC
  Administered 2019-04-16: 13:00:00 12.5 mg via ORAL
  Filled 2019-04-16: qty 1

## 2019-04-16 MED ORDER — TECHNETIUM TC 99M TETROFOSMIN IV KIT
31.8000 | PACK | Freq: Once | INTRAVENOUS | Status: AC | PRN
  Administered 2019-04-16: 11:00:00 31.8 via INTRAVENOUS

## 2019-04-16 MED ORDER — LOSARTAN POTASSIUM 50 MG PO TABS
50.0000 mg | ORAL_TABLET | Freq: Every day | ORAL | Status: DC
  Administered 2019-04-16: 50 mg via ORAL
  Filled 2019-04-16: qty 1

## 2019-04-16 MED ORDER — ATORVASTATIN CALCIUM 20 MG PO TABS: 20.0000 mg | ORAL_TABLET | Freq: Every day | ORAL | 1 refills | Status: DC

## 2019-04-16 MED ORDER — METOPROLOL TARTRATE 25 MG PO TABS: 25.0000 mg | ORAL_TABLET | Freq: Two times a day (BID) | ORAL | 1 refills | Status: DC

## 2019-04-16 MED ORDER — TECHNETIUM TC 99M TETROFOSMIN IV KIT
11.1000 | PACK | Freq: Once | INTRAVENOUS | Status: AC | PRN
  Administered 2019-04-16: 11.1 via INTRAVENOUS

## 2019-04-16 MED ORDER — HYDROCHLOROTHIAZIDE 12.5 MG PO CAPS: 12.5000 mg | ORAL_CAPSULE | Freq: Every day | ORAL | 1 refills | Status: DC

## 2019-04-16 NOTE — Consult Note (Addendum)
Cardiology Consultation:   Patient ID: RILEA YEARBY MRN: [id number]; DOB: [birth year]  Admit date: 04/15/2019 Date of Consult: 04/16/2019  Primary Care Provider: Patient, No Pcp Per Primary Cardiologist: No primary care provider on file.    Patient Profile:   Teresa Glass is a 58 y.o. female with a hx of poorly controlled hypertension who is being seen today for the evaluation of atypical chest pain at the request of Dr. Allena Katz.  History of Present Illness:   Teresa Glass is a 58 year old woman with hypertension, tobacco abuse, and nonadherence to medications admitted with chest pain.  She reports 2 days of intermittent pain in her left chest.  Prior to that she did mow her lawn but denies any other strenuous activities.  Pain occurred at rest and is associated with shortness of breath.  There is no pain upon palpation.  She denies nausea or diaphoresis.  The pain occurs somewhat sporadically but does seem to get worse with exertion.  She denies any history of similar chest pain in the past.  In the ED she had an EKG that showed sinus rhythm with LVH and repolarization abnormalities.  Troponin was minimally elevated to 0.06 and flat. Chest CT was negative for PE and there was no significant arthrosclerosis.  Her chest pain has been intermitted throughout hospitalization and intermittently responsive to sublingual nitroglycerin.   She has not had any lower extremity edema, orthopnea, or PND.  Past Medical History:  Diagnosis Date  . Diverticulitis     History reviewed. No pertinent surgical history.   Home Medications:  Prior to Admission medications   Medication Sig Start Date End Date Taking? Authorizing Provider  aspirin 81 MG chewable tablet Chew 81 mg by mouth daily.   Yes [provider]  acetaminophen (TYLENOL) 500 MG tablet Take 500 mg by mouth every 6 (six) hours as needed. For pain    [provider]  ibuprofen (ADVIL,MOTRIN) 200 MG tablet Take 200 mg by mouth every  6 (six) hours as needed. For pain    [provider]  lisinopril (ZESTRIL) 5 MG tablet Take 1 tablet (5 mg total) by mouth daily. 04/16/19 06/15/19  Houston Siren, MD  metoprolol tartrate (LOPRESSOR) 25 MG tablet Take 1 tablet (25 mg total) by mouth 2 (two) times daily. 04/16/19 06/15/19  Houston Siren, MD    Inpatient Medications: Scheduled Meds: . aspirin  81 mg Oral Daily  . enoxaparin (LOVENOX) injection  40 mg Subcutaneous Q24H  . metoprolol tartrate  25 mg Oral BID   Continuous Infusions:  PRN Meds: acetaminophen, hydrALAZINE, morphine injection, nitroGLYCERIN, ondansetron (ZOFRAN) IV  Allergies:   No Known Allergies  Social History:   Social History   Socioeconomic History  . Marital status: Single    Spouse name: Not on file  . Number of children: Not on file  . Years of education: Not on file  . Highest education level: Not on file  Occupational History  . Not on file  Social Needs  . Financial resource strain: Not on file  . Food insecurity:    Worry: Not on file    Inability: Not on file  . Transportation needs:    Medical: Not on file    Non-medical: Not on file  Tobacco Use  . Smoking status: Current Every Day Smoker    Packs/day: 1.00    Years: 40.00    Pack years: 40.00    Types: Cigarettes  . Smokeless tobacco: Never Used  Substance and Sexual Activity  . Alcohol use: No  . Drug use: No  . Sexual activity: Not on file  Lifestyle  . Physical activity:    Days per week: Not on file    Minutes per session: Not on file  . Stress: Not on file  Relationships  . Social connections:    Talks on phone: Not on file    Gets together: Not on file    Attends religious service: Not on file    Active member of club or organization: Not on file    Attends meetings of clubs or organizations: Not on file    Relationship status: Not on file  . Intimate partner violence:    Fear of current or ex partner: Not on file    Emotionally abused: Not on file     Physically abused: Not on file    Forced sexual activity: Not on file  Other Topics Concern  . Not on file  Social History Narrative  . Not on file    Family History:    Family History  Problem Relation Age of Onset  . Cancer Father      ROS:  Please see the history of present illness.   All other ROS reviewed and negative.     Physical Exam/Data:   Vitals:   04/16/19 0659 04/16/19 0703 04/16/19 0718 04/16/19 1100  BP: (!) 133/92 (!) 143/99 (!) 137/98 (!) 164/112  Pulse: 67 64 60 76  Resp:   16   Temp:   98.2 F (36.8 C) 97.8 F (36.6 C)  TempSrc:   Oral Oral  SpO2: 98% 96% 98% 98%  Weight:      Height:        Intake/Output Summary (Last 24 hours) at 04/16/2019 1151 Last data filed at 04/16/2019 0502 Gross per 24 hour  Intake 120 ml  Output 0 ml  Net 120 ml   Last 3 Weights 04/16/2019 04/15/2019 04/15/2019  Weight (lbs) 173 lb 3.2 oz 173 lb 14.4 oz 168 lb  Weight (kg) 78.563 kg 78.881 kg 76.204 kg     VS:  BP (!) 164/112 (BP Location: Left Arm)   Pulse 76   Temp 97.8 F (36.6 C) (Oral)   Resp 16   Ht 5\' 7"  (1.702 m)   Wt 78.6 kg   SpO2 98%   BMI 27.13 kg/m  , BMI Body mass index is 27.13 kg/m. GENERAL:  Well appearing HEENT: Pupils equal round and reactive, fundi not visualized, oral mucosa unremarkable NECK:  No jugular venous distention, waveform within normal limits, carotid upstroke brisk and symmetric, no bruits LUNGS:  Clear to auscultation bilaterally HEART:  RRR.  PMI not displaced or sustained,S1 and S2 within normal limits, no S3, no S4, no clicks, no rubs, no murmurs ABD:  Flat, positive bowel sounds normal in frequency in pitch, no bruits, no rebound, no guarding, no midline pulsatile mass, no hepatomegaly, no splenomegaly EXT:  2 plus pulses throughout, no edema, no cyanosis no clubbing SKIN:  No rashes no nodules NEURO:  Cranial nerves II through XII grossly intact, motor grossly intact throughout PSYCH:  Cognitively intact, oriented to person  place and time  EKG:  The EKG was personally reviewed and demonstrates:  Sinus rhythm.  Rate 80 bpm.  LVH with secondary repolarization abnormalities. QTc 486 ms.  Telemetry:  Telemetry was personally reviewed and demonstrates:   Sinus rhythm.  No events.  Relevant CV Studies:  Nuclear stress 5/: No significant perfusion  defects on scout images.  Will review full study.   Laboratory Data:  Chemistry Recent Labs  Lab 04/15/19 1353 04/15/19 2335  NA 139  --   K 4.0  --   CL 105  --   CO2 25  --   GLUCOSE 97  --   BUN 15  --   CREATININE 0.72 0.70  CALCIUM 9.1  --   GFRNONAA >60 >60  GFRAA >60 >60  ANIONGAP 9  --     No results for input(s): PROT, ALBUMIN, AST, ALT, ALKPHOS, BILITOT in the last 168 hours. Hematology Recent Labs  Lab 04/15/19 1353 04/15/19 2335  WBC 13.0* 13.2*  RBC 5.66* 5.35*  HGB 15.4* 14.5  HCT 48.0* 44.4  MCV 84.8 83.0  MCH 27.2 27.1  MCHC 32.1 32.7  RDW 14.4 14.5  PLT 491* 468*   Cardiac Enzymes Recent Labs  Lab 04/15/19 1353 04/15/19 1854 04/15/19 2335 04/16/19 0547  TROPONINI 0.06* 0.06* 0.06* 0.05*   No results for input(s): TROPIPOC in the last 168 hours.  BNPNo results for input(s): BNP, PROBNP in the last 168 hours.  DDimer No results for input(s): DDIMER in the last 168 hours.  Radiology/Studies:  Dg Chest 2 View  Result Date: 04/15/2019 CLINICAL DATA:  Chest pain EXAM: CHEST - 2 VIEW COMPARISON:  05/31/2012 FINDINGS: The heart size and mediastinal contours are within normal limits. Both lungs are clear. The visualized skeletal structures are unremarkable. IMPRESSION: No active cardiopulmonary disease. Electronically Signed   By: Deatra Robinson M.D.   On: 04/15/2019 15:22   Ct Angio Chest Pe W And/or Wo Contrast  Result Date: 04/15/2019 CLINICAL DATA:  Left-sided chest and breast pain with shortness of breath. EXAM: CT ANGIOGRAPHY CHEST WITH CONTRAST TECHNIQUE: Multidetector CT imaging of the chest was performed using the standard  protocol during bolus administration of intravenous contrast. Multiplanar CT image reconstructions and MIPs were obtained to evaluate the vascular anatomy. CONTRAST:  75mL OMNIPAQUE IOHEXOL 350 MG/ML SOLN COMPARISON:  CT of the chest on 07/29/2008 FINDINGS: Cardiovascular: The pulmonary arteries are well opacified. There is no evidence of pulmonary embolism. Central pulmonary arteries are normal in caliber. The thoracic aorta is also well opacified. No evidence of thoracic aortic aneurysmal disease or dissection. No significant atherosclerosis. 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 The heart size is normal. No pericardial fluid identified. No significant calcified coronary artery plaque identified. Mediastinum/Nodes: No enlarged mediastinal, hilar, or axillary lymph nodes. Thyroid gland, trachea, and esophagus demonstrate no significant findings. Lungs/Pleura: Bibasilar atelectasis, right greater than left. There is no evidence of pulmonary edema, consolidation, pneumothorax, nodule or pleural fluid. Upper Abdomen: No acute abnormality. Musculoskeletal: No chest wall abnormality. No acute or significant osseous findings. Review of the MIP images confirms the above findings. IMPRESSION: 1. No evidence of pulmonary embolism or other acute findings in the chest. 2. 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 Electronically Signed   By: Irish Lack M.D.   On: 04/15/2019 16:01    Assessment and Plan:   # Atypical chest pain: # Elevated troponin: Ms. Alvira's chest pain is atypical.  Troponin is minimally elevated and flat.  This is more consistent with demand ischemia than obstructive coronary disease.  I have preliminarily reviewed her stress test which does not appear to be positive.  Full report to follow.  I suspect this may be attributable to her poorly controlled  hypertension.   #  Hypertension:  We will start HCTZ 12.5 mg and losartan  daily.  This can be consolidated to one tablet as an outpatient if dose works for her.  Once daily dosing may be helpful given her non-compliance.  She will need PCP follow up for hypertension.  # Tobacco abuse: Smoking cessation advised.       For questions or updates, please contact [facility] Please consult [email or web address] for contact info under     Signed, Chilton Si, MD  04/16/2019 11:51 AM

## 2019-04-16 NOTE — Discharge Summary (Signed)
[facility] [place] [facility]   PATIENT NAME: Teresa Glass    MR#:  [id number]  DATE OF BIRTH:  July 21, 1961  DATE OF ADMISSION:  04/15/2019 ADMITTING PHYSICIAN: Enedina Finner, MD  DATE OF DISCHARGE: 04/16/2019  PRIMARY CARE PHYSICIAN: Patient, No Pcp Per    ADMISSION DIAGNOSIS:  Atypical chest pain [R07.89] Elevated troponin [R79.89] Hypertension, unspecified type [I10]  DISCHARGE DIAGNOSIS:  Active Problems:   Atypical chest pain   Elevated troponin   Hypertension   SECONDARY DIAGNOSIS:   Past Medical History:  Diagnosis Date  . Diverticulitis     HOSPITAL COURSE:   58 year old female with past medical history of hypertension, medical noncompliance who presented to the hospital due to chest pain and also accelerated hypertension.  1.  Chest pain-given patient's history of tobacco abuse and uncontrolled hypertension and poor follow-up as an outpatient she was admitted to the hospital under overnight for chest pain rule out. - Patient had 3 sets of cardiac markers checked which remained flat and did not trend upwards. - Patient underwent a nuclear medicine stress test which was low risk without any evidence of acute ischemia. -Patient's chest pain has now resolved and most likely the cause of this was her uncontrolled hypertension.  Patient is being discharged on antihypertensives with outpatient follow-up and referral to PCP.  2.  Essential hypertension-patient's blood pressures were uncontrolled due to medical noncompliance. - Patient was started on oral antihypertensives with losartan and HCTZ and is currently being discharged on that.  Further changes to her blood pressure medicine can be done as an outpatient.  3.  Hyperlipidemia-this is based off a lipid profile on admission.  Patient's total cholesterol is over 200. - Patient is being discharged on low-dose atorvastatin.  DISCHARGE CONDITIONS:   Stable.   CONSULTS OBTAINED:  Treatment Team:   Chilton Si, MD  DRUG ALLERGIES:  No Known Allergies  DISCHARGE MEDICATIONS:   Allergies as of 04/16/2019   No Known Allergies     Medication List    TAKE these medications   acetaminophen 500 MG tablet Commonly known as:  TYLENOL Take 500 mg by mouth every 6 (six) hours as needed. For pain   aspirin 81 MG chewable tablet Chew 81 mg by mouth daily.   atorvastatin 20 MG tablet Commonly known as:  Lipitor Take 1 tablet (20 mg total) by mouth daily.   hydrochlorothiazide 12.5 MG capsule Commonly known as:  MICROZIDE Take 1 capsule (12.5 mg total) by mouth daily. Start taking on:  Apr 17, 2019   ibuprofen 200 MG tablet Commonly known as:  ADVIL Take 200 mg by mouth every 6 (six) hours as needed. For pain   losartan 50 MG tablet Commonly known as:  COZAAR Take 1 tablet (50 mg total) by mouth daily. Start taking on:  Apr 17, 2019         DISCHARGE INSTRUCTIONS:   DIET:  Cardiac diet  DISCHARGE CONDITION:  Stable  ACTIVITY:  Activity as tolerated  OXYGEN:  Home Oxygen: No.   Oxygen Delivery: room air  DISCHARGE LOCATION:  home   If you experience worsening of your admission symptoms, develop shortness of breath, life threatening emergency, suicidal or homicidal thoughts you must seek medical attention immediately by calling 911 or calling your MD immediately  if symptoms less severe.  You Must read complete instructions/literature along with all the possible adverse reactions/side effects for all the Medicines you take and that have been prescribed to you. Take any  new Medicines after you have completely understood and accpet all the possible adverse reactions/side effects.   Please note  You were cared for by a hospitalist during your hospital stay. If you have any questions about your discharge medications or the care you received while you were in the hospital after you are discharged, you can call the unit and asked to speak with the hospitalist  on call if the hospitalist that took care of you is not available. Once you are discharged, your primary care physician will handle any further medical issues. Please note that NO REFILLS for any discharge medications will be authorized once you are discharged, as it is imperative that you return to your primary care physician (or establish a relationship with a primary care physician if you do not have one) for your aftercare needs so that they can reassess your need for medications and monitor your lab values.     Today   No acute events overnight.  Chest pain has now resolved.  Patient stress test was low risk.  Currently asymptomatic and will discharge home today on oral blood pressure regimen.  VITAL SIGNS:  Blood pressure (!) 164/112, pulse 76, temperature 97.8 F (36.6 C), temperature source Oral, resp. rate 16, height  (1.702 m), weight 78.6 kg, SpO2 98 %.  I/O:    Intake/Output Summary (Last 24 hours) at 04/16/2019 1320 Last data filed at 04/16/2019 0502 Gross per 24 hour  Intake 120 ml  Output 0 ml  Net 120 ml    PHYSICAL EXAMINATION:  GENERAL:  58 y.o.-year-old patient lying in the bed with no acute distress.  EYES: Pupils equal, round, reactive to light and accommodation. No scleral icterus. Extraocular muscles intact.  HEENT: Head atraumatic, normocephalic. Oropharynx and nasopharynx clear.  NECK:  Supple, no jugular venous distention. No thyroid enlargement, no tenderness.  LUNGS: Normal breath sounds bilaterally, no wheezing, rales,rhonchi. No use of accessory muscles of respiration.  CARDIOVASCULAR: S1, S2 normal. No murmurs, rubs, or gallops.  ABDOMEN: Soft, non-tender, non-distended. Bowel sounds present. No organomegaly or mass.  EXTREMITIES: No pedal edema, cyanosis, or clubbing.  NEUROLOGIC: Cranial nerves II through XII are intact. No focal motor or sensory defecits b/l.  PSYCHIATRIC: The patient is alert and oriented x 3. SKIN: No obvious rash, lesion, or  ulcer.   DATA REVIEW:   CBC Recent Labs  Lab 04/15/19 2335  WBC 13.2*  HGB 14.5  HCT 44.4  PLT 468*    Chemistries  Recent Labs  Lab 04/15/19 1353 04/15/19 2335  NA 139  --   K 4.0  --   CL 105  --   CO2 25  --   GLUCOSE 97  --   BUN 15  --   CREATININE 0.72 0.70  CALCIUM 9.1  --     Cardiac Enzymes Recent Labs  Lab 04/16/19 0547  TROPONINI 0.05*    Microbiology Results  No results found for this or any previous visit.  RADIOLOGY:  Dg Chest 2 View  Result Date: 04/15/2019 CLINICAL DATA:  Chest pain EXAM: CHEST - 2 VIEW COMPARISON:  05/31/2012 FINDINGS: The heart size and mediastinal contours are within normal limits. Both lungs are clear. The visualized skeletal structures are unremarkable. IMPRESSION: No active cardiopulmonary disease. Electronically Signed   By: Deatra Robinson M.D.   On: 04/15/2019 15:22   Ct Angio Chest Pe W And/or Wo Contrast  Result Date: 04/15/2019 CLINICAL DATA:  Left-sided chest and breast pain with shortness of breath.  EXAM: CT ANGIOGRAPHY CHEST WITH CONTRAST TECHNIQUE: Multidetector CT imaging of the chest was performed using the standard protocol during bolus administration of intravenous contrast. Multiplanar CT image reconstructions and MIPs were obtained to evaluate the vascular anatomy. CONTRAST:  75mL OMNIPAQUE IOHEXOL 350 MG/ML SOLN COMPARISON:  CT of the chest on 07/29/2008 FINDINGS: Cardiovascular: The pulmonary arteries are well opacified. There is no evidence of pulmonary embolism. Central pulmonary arteries are normal in caliber. The thoracic aorta is also well opacified. No evidence of thoracic aortic aneurysmal disease or dissection. No significant atherosclerosis. 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 The heart size is normal. No pericardial fluid identified. No significant calcified coronary artery plaque identified. Mediastinum/Nodes: No  enlarged mediastinal, hilar, or axillary lymph nodes. Thyroid gland, trachea, and esophagus demonstrate no significant findings. Lungs/Pleura: Bibasilar atelectasis, right greater than left. There is no evidence of pulmonary edema, consolidation, pneumothorax, nodule or pleural fluid. Upper Abdomen: No acute abnormality. Musculoskeletal: No chest wall abnormality. No acute or significant osseous findings. Review of the MIP images confirms the above findings. IMPRESSION: 1. No evidence of pulmonary embolism or other acute findings in the chest. 2. 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 Electronically Signed   By: Irish LackGlenn  Yamagata M.D.   On: 04/15/2019 16:01   Nm Myocar Multi W/spect W/wall Motion / Ef  Result Date: 04/16/2019  Blood pressure demonstrated a normal response to exercise.  There was no ST segment deviation noted during stress.  The study is normal.  This is a low risk study.  The calculated left ventricular ejection fraction is moderately decreased (30-44%). However visually it appears normal.  Recommend outpatient correlation of LVEF by echocardiogram.       Management plans discussed with the patient, family and they are in agreement.  CODE STATUS:     Code Status Orders  (From admission, onward)         Start     Ordered   04/15/19 1941  Full code  Continuous     04/15/19 1940        Code Status History    This patient has a current code status but no historical code status.      TOTAL TIME TAKING CARE OF THIS PATIENT: 40 minutes.    Houston SirenVivek J Cassy Sprowl M.D on 04/16/2019 at 1:20 PM  Between 7am to 6pm - Pager - [phone]  After 6pm go to [email or web address] - Social research officer, governmentpassword EPAS [facility]  [facility] [place] [facility]  Office  [phone]  CC: Primary care physician; Patient, No Pcp Per

## 2019-04-16 NOTE — H&P (Signed)
[facility] - [place] at [facility]   PATIENT NAME: Teresa Glass    MR#:  [id number]  DATE OF BIRTH:  1961/05/31  DATE OF ADMISSION:  04/15/2019  PRIMARY CARE PHYSICIAN: Patient, No Pcp Per   REQUESTING/REFERRING PHYSICIAN: Dr Fanny Bien  CHIEF COMPLAINT:   Chest pain in the left breast since yesterday HISTORY OF PRESENT ILLNESS:  Teresa Glass  is a 58 y.o. female with a known history of hypertension noncompliant with meds, tobacco abuse comes to the emergency room after she started noticing chest pain under the left breast. Patient had done lawn moving prior to that. She denies any pain on palpation. Pain is present with taking deep breath. Denies any cardiac history  In the ER she was found to have troponin of .06. EKG did not show any acute ST elevation her depression. CT chest negative for PE. Patient is being admitted for chest pain rule out. 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  PAST MEDICAL HISTORY:   Past Medical History:  Diagnosis Date  . Diverticulitis     PAST SURGICAL HISTOIRY:  History reviewed. No pertinent surgical history.  SOCIAL HISTORY:   Social History   Tobacco Use  . Smoking status: Current Every Day Smoker    Packs/day: 1.00    Years: 40.00    Pack years: 40.00    Types: Cigarettes  . Smokeless tobacco: Never Used  Substance Use Topics  . Alcohol use: No    FAMILY HISTORY:   Family History  Problem Relation Age of Onset  . Cancer Father     DRUG ALLERGIES:  No Known Allergies  REVIEW OF SYSTEMS:  Review of Systems  Constitutional: Negative for chills, fever and weight loss.  HENT: Negative for ear discharge, ear pain and nosebleeds.   Eyes: Negative for blurred vision, pain and discharge.  Respiratory: Negative for sputum production, shortness of breath, wheezing and stridor.   Cardiovascular: Positive for chest pain. Negative for palpitations, orthopnea and PND.  Gastrointestinal: Negative for  abdominal pain, diarrhea, nausea and vomiting.  Genitourinary: Negative for frequency and urgency.  Musculoskeletal: Negative for back pain and joint pain.  Neurological: Negative for sensory change, speech change, focal weakness and weakness.  Psychiatric/Behavioral: Negative for depression and hallucinations. The patient is not nervous/anxious.      MEDICATIONS AT HOME:   Prior to Admission medications   Medication Sig Start Date End Date Taking? Authorizing Provider  aspirin 81 MG chewable tablet Chew 81 mg by mouth daily.   Yes [provider]  acetaminophen (TYLENOL) 500 MG tablet Take 500 mg by mouth every 6 (six) hours as needed. For pain    [provider]  ibuprofen (ADVIL,MOTRIN) 200 MG tablet Take 200 mg by mouth every 6 (six) hours as needed. For pain    [provider]      VITAL SIGNS:  Blood pressure (!) 137/98, pulse 60, temperature 98.2 F (36.8 C), temperature source Oral, resp. rate 16, height 5\' 7"  (1.702 m), weight 78.6 kg, SpO2 98 %.  PHYSICAL EXAMINATION:  GENERAL:  58 y.o.-year-old patient lying in the bed with no acute distress.  EYES: Pupils equal, round, reactive to light and accommodation. No scleral icterus. Extraocular muscles intact.  HEENT: Head atraumatic, normocephalic. Oropharynx and nasopharynx clear.  NECK:  Supple, no jugular venous distention. No thyroid enlargement, no tenderness.  LUNGS: Normal breath sounds bilaterally, no wheezing, rales,rhonchi or crepitation. No use of accessory muscles of respiration.  CARDIOVASCULAR: S1, S2  normal. No murmurs, rubs, or gallops.  ABDOMEN: Soft, nontender, nondistended. Bowel sounds present. No organomegaly or mass.  EXTREMITIES: No pedal edema, cyanosis, or clubbing.  NEUROLOGIC: Cranial nerves II through XII are intact. Muscle strength 5/5 in all extremities. Sensation intact. Gait not checked.  PSYCHIATRIC: The patient is alert and oriented x 3.  SKIN: No obvious rash, lesion,  or ulcer.   LABORATORY PANEL:   CBC Recent Labs  Lab 04/15/19 2335  WBC 13.2*  HGB 14.5  HCT 44.4  PLT 468*   ------------------------------------------------------------------------------------------------------------------  Chemistries  Recent Labs  Lab 04/15/19 1353 04/15/19 2335  NA 139  --   K 4.0  --   CL 105  --   CO2 25  --   GLUCOSE 97  --   BUN 15  --   CREATININE 0.72 0.70  CALCIUM 9.1  --    ------------------------------------------------------------------------------------------------------------------  Cardiac Enzymes Recent Labs  Lab 04/16/19 0547  TROPONINI 0.05*   ------------------------------------------------------------------------------------------------------------------  RADIOLOGY:  Dg Chest 2 View  Result Date: 04/15/2019 CLINICAL DATA:  Chest pain EXAM: CHEST - 2 VIEW COMPARISON:  05/31/2012 FINDINGS: The heart size and mediastinal contours are within normal limits. Both lungs are clear. The visualized skeletal structures are unremarkable. IMPRESSION: No active cardiopulmonary disease. Electronically Signed   By: Deatra RobinsonKevin  Herman M.D.   On: 04/15/2019 15:22   Ct Angio Chest Pe W And/or Wo Contrast  Result Date: 04/15/2019 CLINICAL DATA:  Left-sided chest and breast pain with shortness of breath. EXAM: CT ANGIOGRAPHY CHEST WITH CONTRAST TECHNIQUE: Multidetector CT imaging of the chest was performed using the standard protocol during bolus administration of intravenous contrast. Multiplanar CT image reconstructions and MIPs were obtained to evaluate the vascular anatomy. CONTRAST:  75mL OMNIPAQUE IOHEXOL 350 MG/ML SOLN COMPARISON:  CT of the chest on 07/29/2008 FINDINGS: Cardiovascular: The pulmonary arteries are well opacified. There is no evidence of pulmonary embolism. Central pulmonary arteries are normal in caliber. The thoracic aorta is also well opacified. No evidence of thoracic aortic aneurysmal disease or dissection. No significant  atherosclerosis. 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 The heart size is normal. No pericardial fluid identified. No significant calcified coronary artery plaque identified. Mediastinum/Nodes: No enlarged mediastinal, hilar, or axillary lymph nodes. Thyroid gland, trachea, and esophagus demonstrate no significant findings. Lungs/Pleura: Bibasilar atelectasis, right greater than left. There is no evidence of pulmonary edema, consolidation, pneumothorax, nodule or pleural fluid. Upper Abdomen: No acute abnormality. Musculoskeletal: No chest wall abnormality. No acute or significant osseous findings. Review of the MIP images confirms the above findings. IMPRESSION: 1. No evidence of pulmonary embolism or other acute findings in the chest. 2. 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 Electronically Signed   By: Irish LackGlenn  Yamagata M.D.   On: 04/15/2019 16:01    EKG:  normal sinus rhythm, no acute STT changes. LVH.  IMPRESSION AND PLAN:   Teresa Glass  is a 58 y.o. female with a known history of hypertension noncompliant with meds, tobacco abuse comes to the emergency room after she started noticing chest pain under the left breast. Patient had done lawn moving prior to that. She denies any pain on palpation. Pain is present with taking deep breath.  1. chest pain rule out MI -troponin times three--- if troponin continues to rise cardiology consultation -myoview stress test in the morning -aspirin, beta blockers -check lipid profile  2. Malignant hypertension -patient noncompliant with meds -  started on beta-blocker. Adjust blood pressure meds accordingly  3. Tobacco abuse patient advised smoking cessation  4. DVT prophylaxis subcu Lovenox All the records are reviewed and case discussed with ED provider.   CODE STATUS: *full code TOTAL TIME TAKING CARE OF THIS  PATIENT: *45** minutes.    Enedina Finner M.D   Between 7am to 6pm - Pager [phone]  After 6pm go to [email or web address] - password [email or web address] [facility]  [facility]  Office  [phone]  CC: Primary care physician; Patient, No Pcp Per

## 2020-05-23 IMAGING — CT CT ANGIOGRAPHY CHEST
2 of 6 series · 18 of 46 positions shown · IV contrast (omnipaque)
Comparison: CT of the chest on 07/29/2008

CLINICAL DATA: Left-sided chest and breast pain with shortness of
breath.

EXAM:
CT ANGIOGRAPHY CHEST WITH CONTRAST
TECHNIQUE: Multidetector CT imaging of the chest was performed using the
standard protocol during bolus administration of intravenous
contrast. Multiplanar CT image reconstructions and MIPs were
obtained to evaluate the vascular anatomy.
CONTRAST:  75mL OMNIPAQUE IOHEXOL 350 MG/ML SOLN

[Series 5: thins · axial · 0.63mm/px · z∈[-38,+166]mm · 16 of 225 slices shown]
[im 10/225  lung]
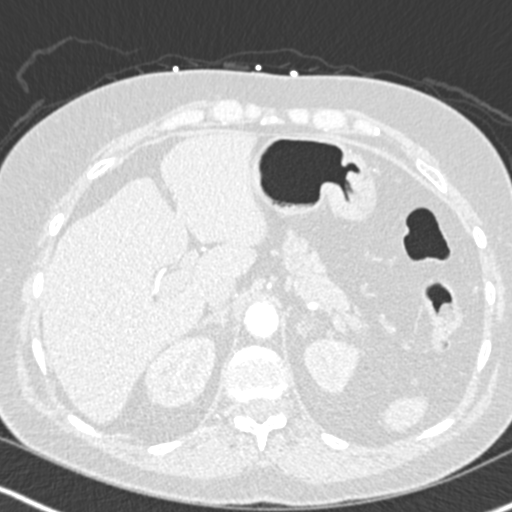
[im 30/225  soft-tissue]
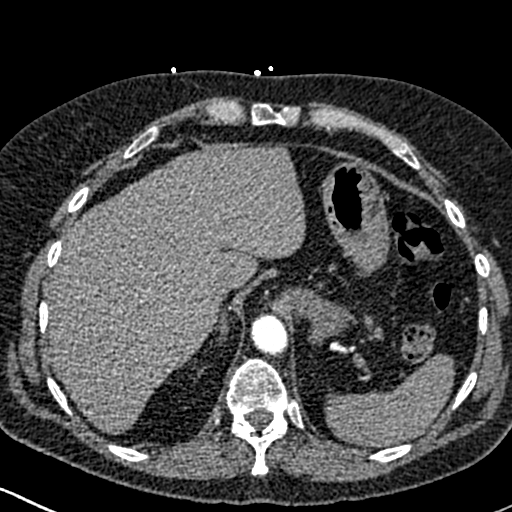
[im 39/225  lung]
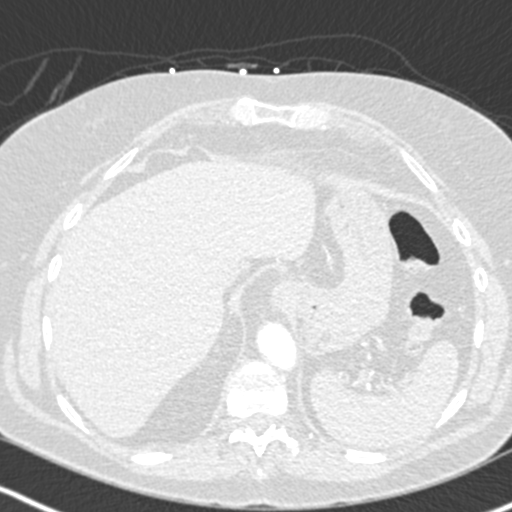
[im 49/225  soft-tissue]
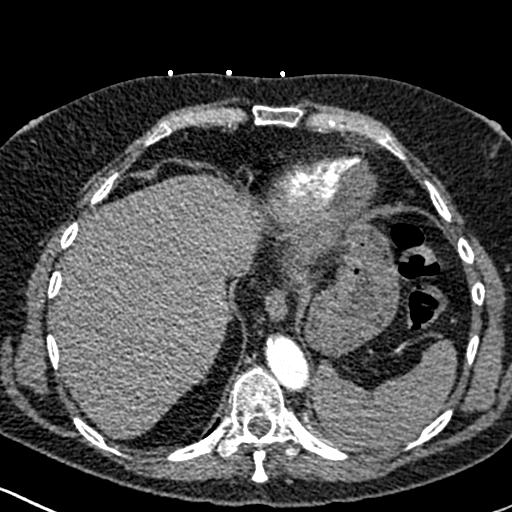
[im 69/225  lung]
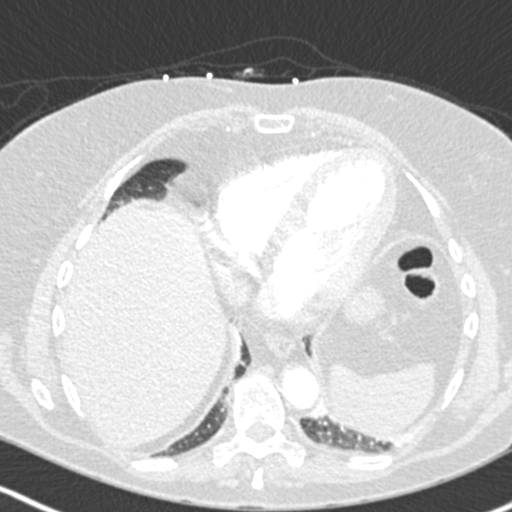
[im 78/225  soft-tissue]
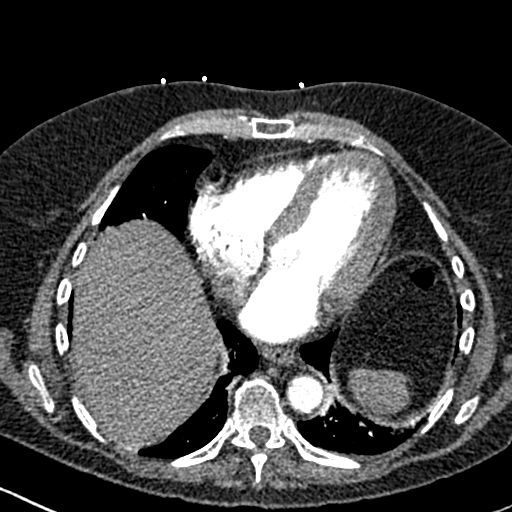
[im 88/225  lung]
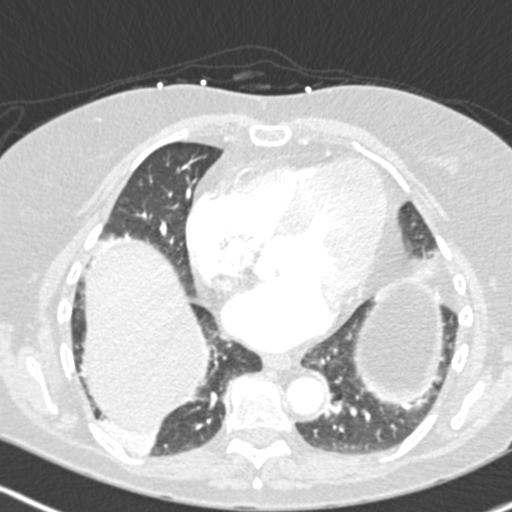
[im 108/225  soft-tissue]
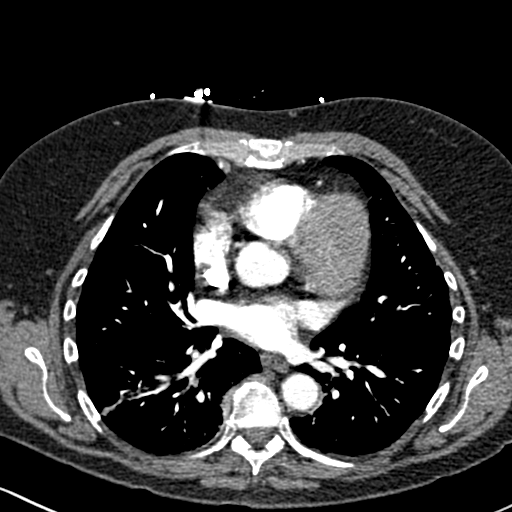
[im 117/225  lung]
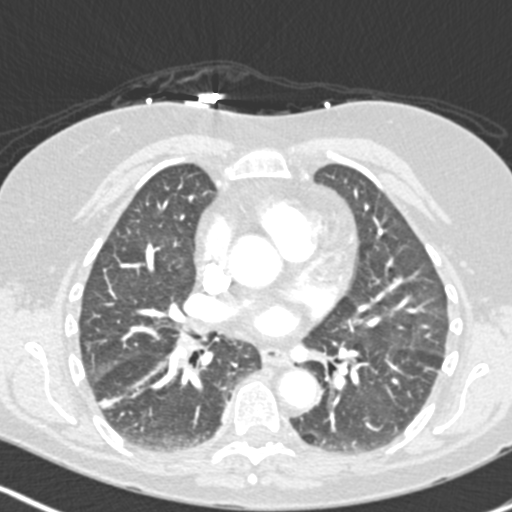
[im 137/225  soft-tissue]
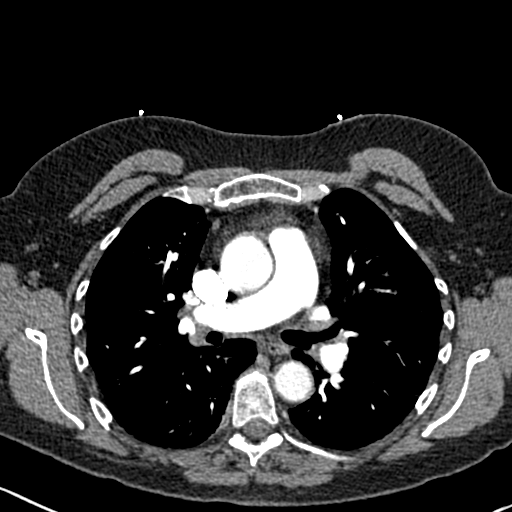
[im 147/225  lung]
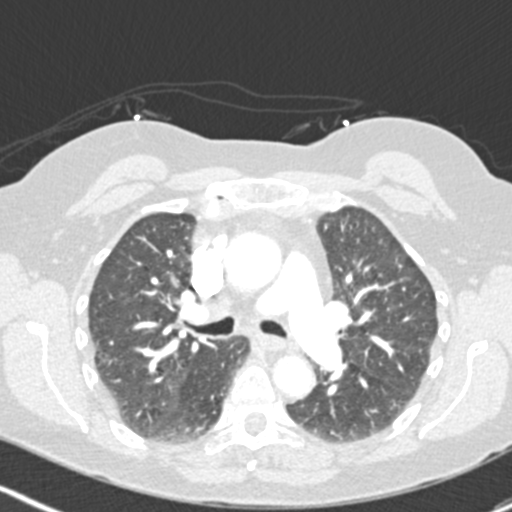
[im 156/225  soft-tissue]
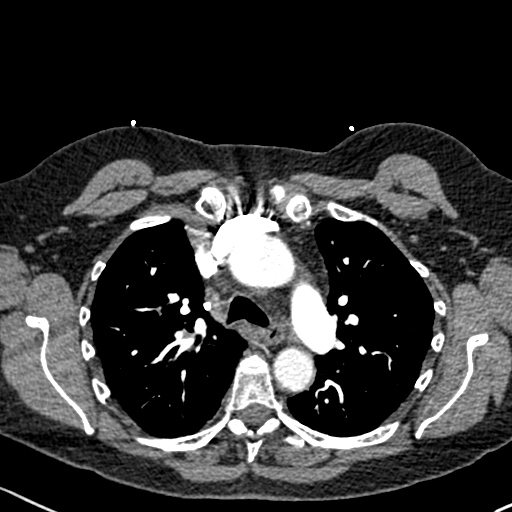
[im 176/225  lung]
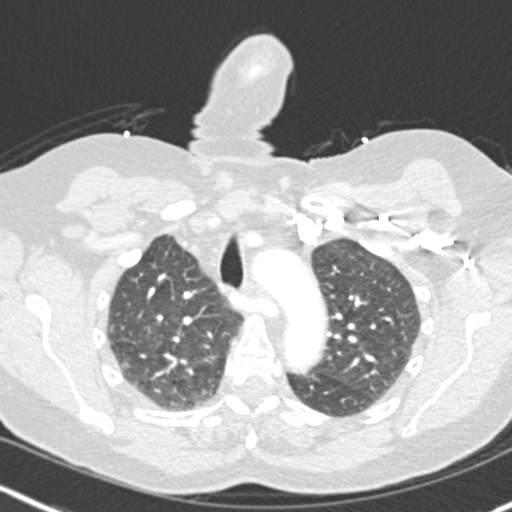
[im 186/225  soft-tissue]
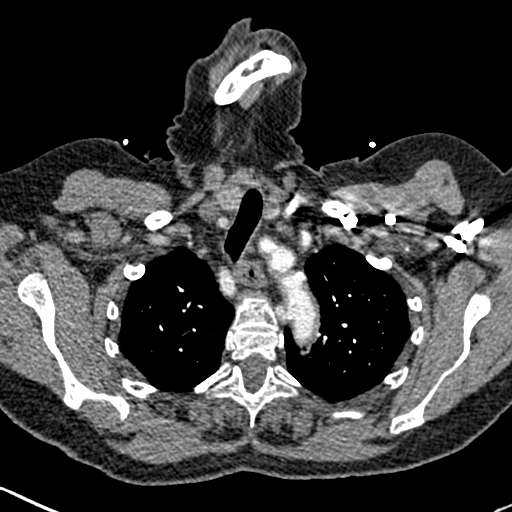
[im 195/225  lung]
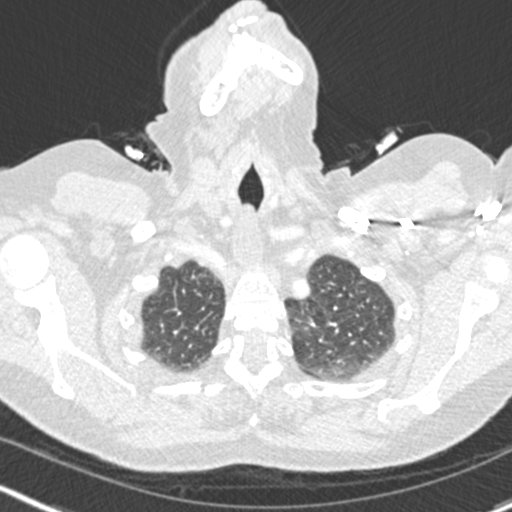
[im 215/225  soft-tissue]
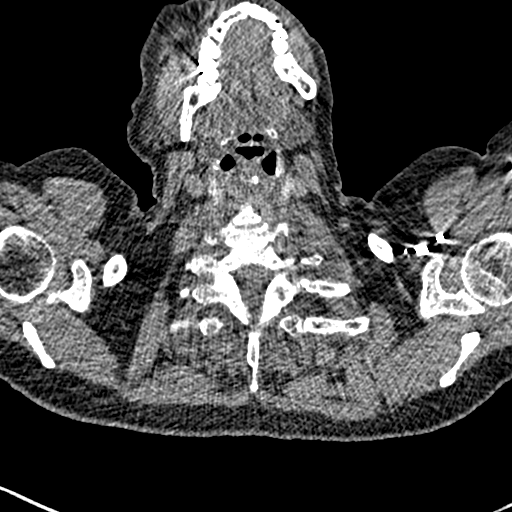

[Series 7: coronal mpr · coronal · 0.49mm/px · 2 of 88 slices shown]
[im 30/88  soft-tissue]
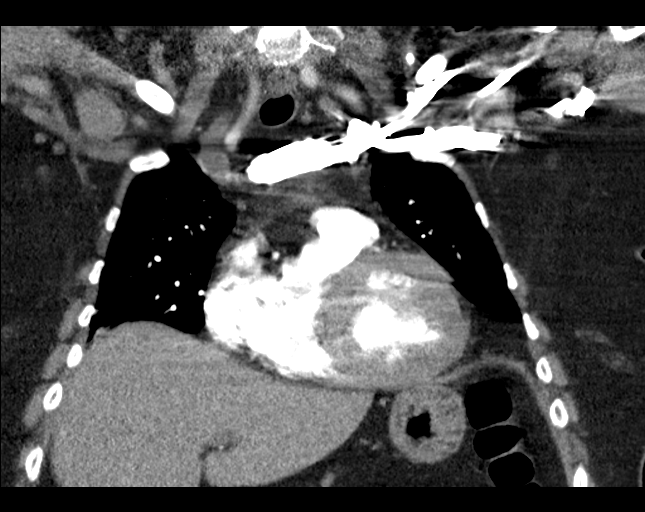
[im 59/88  soft-tissue]
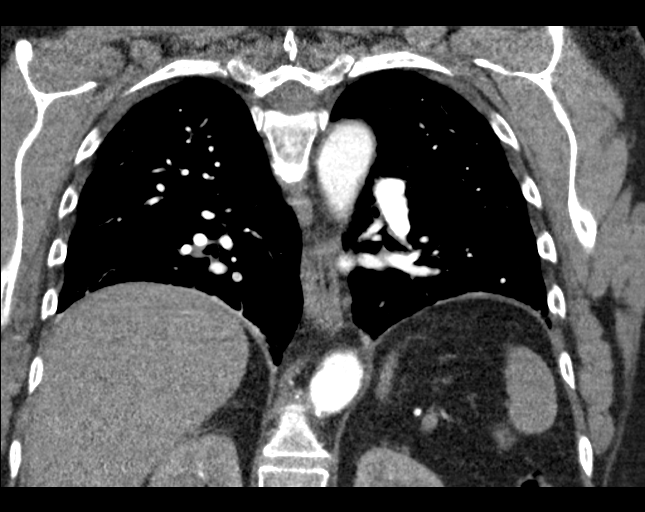

[18 of 46 positions shown; findings below may reference images not displayed]

FINDINGS: Cardiovascular: The pulmonary arteries are well opacified. There is
no evidence of pulmonary embolism. Central pulmonary arteries are
normal in caliber. The thoracic aorta is also well opacified. No
evidence of thoracic aortic aneurysmal disease or dissection. No
significant atherosclerosis. Proximal great vessels are normally
patent and demonstrate incidental aberrant right subclavian artery
with retroesophageal course.

The heart size is normal. No pericardial fluid identified. No
significant calcified coronary artery plaque identified.

Mediastinum/Nodes: No enlarged mediastinal, hilar, or axillary lymph
nodes. Thyroid gland, trachea, and esophagus demonstrate no
significant findings.

Lungs/Pleura: Bibasilar atelectasis, right greater than left. There
is no evidence of pulmonary edema, consolidation, pneumothorax,
nodule or pleural fluid.

Upper Abdomen: No acute abnormality.

Musculoskeletal: No chest wall abnormality. No acute or significant
osseous findings.

Review of the MIP images confirms the above findings.
IMPRESSION: 1. No evidence of pulmonary embolism or other acute findings in the
chest.
2. Incidental aberrant right subclavian artery with retroesophageal
course in the mediastinum.

## 2024-07-03 ENCOUNTER — Observation Stay
Admission: EM | Admit: 2024-07-03 | Discharge: 2024-07-05 | Disposition: A | Discharge status: Home / Self Care | Attending: Emergency Medicine | Admitting: Emergency Medicine

## 2024-07-03 ENCOUNTER — Emergency Department

## 2024-07-03 ENCOUNTER — Other Ambulatory Visit: Payer: Self-pay

## 2024-07-03 ENCOUNTER — Encounter: Payer: Self-pay | Admitting: Intensive Care

## 2024-07-03 DIAGNOSIS — I200 Unstable angina: Secondary | ICD-10-CM | POA: Diagnosis not present

## 2024-07-03 DIAGNOSIS — F419 Anxiety disorder, unspecified: Secondary | ICD-10-CM | POA: Diagnosis not present

## 2024-07-03 DIAGNOSIS — F17210 Nicotine dependence, cigarettes, uncomplicated: Secondary | ICD-10-CM | POA: Insufficient documentation

## 2024-07-03 DIAGNOSIS — E785 Hyperlipidemia, unspecified: Secondary | ICD-10-CM | POA: Insufficient documentation

## 2024-07-03 DIAGNOSIS — Z79899 Other long term (current) drug therapy: Secondary | ICD-10-CM | POA: Diagnosis not present

## 2024-07-03 DIAGNOSIS — I10 Essential (primary) hypertension: Secondary | ICD-10-CM | POA: Diagnosis not present

## 2024-07-03 DIAGNOSIS — R079 Chest pain, unspecified: Secondary | ICD-10-CM | POA: Diagnosis present

## 2024-07-03 DIAGNOSIS — F1090 Alcohol use, unspecified, uncomplicated: Secondary | ICD-10-CM | POA: Diagnosis not present

## 2024-07-03 DIAGNOSIS — Z7901 Long term (current) use of anticoagulants: Secondary | ICD-10-CM | POA: Insufficient documentation

## 2024-07-03 DIAGNOSIS — F17200 Nicotine dependence, unspecified, uncomplicated: Principal | ICD-10-CM

## 2024-07-03 DIAGNOSIS — E876 Hypokalemia: Secondary | ICD-10-CM | POA: Diagnosis not present

## 2024-07-03 LAB — COMPREHENSIVE METABOLIC PANEL WITH GFR
ALT: 18 U/L (ref 0–44)
AST: 21 U/L (ref 15–41)
Albumin: 3.1 g/dL — ABNORMAL LOW (ref 3.5–5.0)
Alkaline Phosphatase: 71 U/L (ref 38–126)
Anion gap: 9 (ref 5–15)
BUN: 18 mg/dL (ref 8–23)
CO2: 26 mmol/L (ref 22–32)
Calcium: 8.4 mg/dL — ABNORMAL LOW (ref 8.9–10.3)
Chloride: 107 mmol/L (ref 98–111)
Creatinine, Ser: 0.86 mg/dL (ref 0.44–1.00)
GFR, Estimated: >60 mL/min (ref >60)
Glucose, Bld: 89 mg/dL (ref 70–99)
Potassium: 3.2 mmol/L — ABNORMAL LOW (ref 3.5–5.1)
Sodium: 142 mmol/L (ref 135–145)
Total Bilirubin: 0.7 mg/dL (ref 0.0–1.2)
Total Protein: 6.1 g/dL — ABNORMAL LOW (ref 6.5–8.1)

## 2024-07-03 LAB — CBC
HCT: 45 % (ref 36.0–46.0)
Hemoglobin: 14.6 g/dL (ref 12.0–15.0)
MCH: 26.9 pg (ref 26.0–34.0)
MCHC: 32.4 g/dL (ref 30.0–36.0)
MCV: 82.9 fL (ref 80.0–100.0)
Platelets: 422 K/uL — ABNORMAL HIGH (ref 150–400)
RBC: 5.43 MIL/uL — ABNORMAL HIGH (ref 3.87–5.11)
RDW: 14.6 % (ref 11.5–15.5)
WBC: 18.9 K/uL — ABNORMAL HIGH (ref 4.0–10.5)
nRBC: 0 % (ref 0.0–0.2)

## 2024-07-03 LAB — TROPONIN I (HIGH SENSITIVITY)
Troponin I (High Sensitivity): 10 ng/L (ref <18)
Troponin I (High Sensitivity): 11 ng/L (ref <18)

## 2024-07-03 LAB — BRAIN NATRIURETIC PEPTIDE: B Natriuretic Peptide: 34.8 pg/mL (ref 0.0–100.0)

## 2024-07-03 MED ORDER — ASPIRIN 81 MG PO TBEC: 81.0000 mg | DELAYED_RELEASE_TABLET | Freq: Every day | ORAL | Status: DC

## 2024-07-03 MED ORDER — ONDANSETRON HCL 4 MG/2ML IJ SOLN: 4.0000 mg | Freq: Four times a day (QID) | INTRAMUSCULAR | Status: DC | PRN

## 2024-07-03 MED ORDER — ACETAMINOPHEN 325 MG PO TABS: 650.0000 mg | ORAL_TABLET | ORAL | Status: DC | PRN

## 2024-07-03 MED ORDER — NITROGLYCERIN 0.4 MG SL SUBL
0.4000 mg | SUBLINGUAL_TABLET | SUBLINGUAL | Status: DC | PRN
  Administered 2024-07-03 – 2024-07-04 (×2): 0.4 mg via SUBLINGUAL
  Filled 2024-07-03 (×2): qty 1

## 2024-07-03 MED ORDER — TRAZODONE HCL 50 MG PO TABS: 25.0000 mg | ORAL_TABLET | Freq: Every evening | ORAL | Status: DC | PRN

## 2024-07-03 MED ORDER — ATORVASTATIN CALCIUM 20 MG PO TABS
20.0000 mg | ORAL_TABLET | Freq: Every day | ORAL | Status: DC
  Administered 2024-07-03 – 2024-07-04 (×2): 20 mg via ORAL
  Filled 2024-07-03 (×2): qty 1

## 2024-07-03 MED ORDER — ALPRAZOLAM 0.25 MG PO TABS: 0.2500 mg | ORAL_TABLET | Freq: Two times a day (BID) | ORAL | Status: DC | PRN

## 2024-07-03 MED ORDER — POTASSIUM CHLORIDE CRYS ER 20 MEQ PO TBCR
40.0000 meq | EXTENDED_RELEASE_TABLET | Freq: Once | ORAL | Status: AC
  Administered 2024-07-03: 40 meq via ORAL
  Filled 2024-07-03: qty 2

## 2024-07-03 MED ORDER — ENOXAPARIN SODIUM 40 MG/0.4ML IJ SOSY
40.0000 mg | PREFILLED_SYRINGE | INTRAMUSCULAR | Status: DC
  Administered 2024-07-03 – 2024-07-04 (×2): 40 mg via SUBCUTANEOUS
  Filled 2024-07-03 (×2): qty 0.4

## 2024-07-03 MED ORDER — MORPHINE SULFATE (PF) 2 MG/ML IV SOLN: 2.0000 mg | INTRAVENOUS | Status: DC | PRN

## 2024-07-03 MED ORDER — SODIUM CHLORIDE 0.9 % IV SOLN: INTRAVENOUS | Status: DC

## 2024-07-03 MED ORDER — ASPIRIN 81 MG PO CHEW
162.0000 mg | CHEWABLE_TABLET | Freq: Once | ORAL | Status: AC
  Administered 2024-07-03: 162 mg via ORAL
  Filled 2024-07-03: qty 2

## 2024-07-03 MED ORDER — ASPIRIN 81 MG PO TBEC
81.0000 mg | DELAYED_RELEASE_TABLET | Freq: Every day | ORAL | Status: DC
  Administered 2024-07-04 – 2024-07-05 (×2): 81 mg via ORAL
  Filled 2024-07-03 (×2): qty 1

## 2024-07-03 NOTE — ED Provider Notes (Signed)
 [facility] Provider Note    Event Date/Time   First MD Initiated Contact with Patient 07/03/24 1717     (approximate)   History   Chest Pain   HPI  Teresa Glass is a 63 y.o. female who presents to the ED for evaluation of Chest Pain   I review a cardiology clinic visit from 5 years ago.  Nothing more recent.  History of HTN, HLD, COPD and tobacco abuse.  Depressed ejection fraction and CHF.  Patient presents to the ED due to chest pain that awoke her from sleep and has been present throughout much of the day over the past 12 hours.  Chest pressure and dyspnea on exertion without orthopnea.  No dizziness or syncope.  No abdominal pain or emesis.  No fevers or cough.  Noncompliant with her medications   Physical Exam   Triage Vital Signs: ED Triage Vitals [07/03/24 1708]  Encounter Vitals Group     BP 136/82     Girls Systolic BP Percentile      Girls Diastolic BP Percentile      Boys Systolic BP Percentile      Boys Diastolic BP Percentile      Pulse Rate 92     Resp 16     Temp 98 F (36.7 C)     Temp Source Oral     SpO2 100 %     Weight 161 lb (73 kg)     Height 5' 7 (1.702 m)     Head Circumference      Peak Flow      Pain Score 5     Pain Loc      Pain Education      Exclude from Growth Chart     Most recent vital signs: Vitals:   07/03/24 2030 07/03/24 2103  BP: (!) 142/89   Pulse: 81   Resp: 18   Temp:  98.1 F (36.7 C)  SpO2: 97%     General: Awake, no distress.  CV:  Good peripheral perfusion.  Resp:  Normal effort.  Abd:  No distention.  MSK:  No deformity noted.  Neuro:  No focal deficits appreciated. Other:     ED Results / Procedures / Treatments   Labs (all labs ordered are listed, but only abnormal results are displayed) Labs Reviewed  CBC - Abnormal; Notable for the following components:      Result Value   WBC 18.9 (*)    RBC 5.43 (*)    Platelets 422 (*)    All other components within normal  limits  COMPREHENSIVE METABOLIC PANEL WITH GFR - Abnormal; Notable for the following components:   Potassium 3.2 (*)    Calcium  8.4 (*)    Total Protein 6.1 (*)    Albumin 3.1 (*)    All other components within normal limits  BRAIN NATRIURETIC PEPTIDE  TROPONIN I (HIGH SENSITIVITY)  TROPONIN I (HIGH SENSITIVITY)    EKG Sinus rhythm the rate of 90 bpm.  Normal axis.  QTc 496.  No for signs of acute ischemia.  RADIOLOGY CXR interpreted by me without evidence of acute cardiopulmonary pathology.  Official radiology report(s): DG Chest 2 View Result Date: 07/03/2024 CLINICAL DATA:  Chest pain. EXAM: CHEST - 2 VIEW COMPARISON:  Radiograph and CT 04/15/2019 FINDINGS: The cardiomediastinal contours are stable. Aortic atherosclerosis. The lungs are clear. Pulmonary vasculature is normal. No consolidation, pleural effusion, or pneumothorax. Exaggerated upper thoracic kyphosis. No acute osseous  abnormalities are seen. IMPRESSION: No active cardiopulmonary disease. Electronically Signed   By: Andrea Gasman M.D.   On: 07/03/2024 18:09    PROCEDURES and INTERVENTIONS:  .1-3 Lead EKG Interpretation  Performed by: Claudene Rover, MD Authorized by: Claudene Rover, MD     Interpretation: normal     ECG rate:  80   ECG rate assessment: normal     Rhythm: sinus rhythm     Ectopy: none     Conduction: normal     Medications  nitroGLYCERIN  (NITROSTAT ) SL tablet 0.4 mg (0.4 mg Sublingual Given 07/03/24 1837)  aspirin  chewable tablet 162 mg (162 mg Oral Given 07/03/24 1837)  potassium chloride  SA (KLOR-CON  M) CR tablet 40 mEq (40 mEq Oral Given 07/03/24 2134)     IMPRESSION / MDM / ASSESSMENT AND PLAN / ED COURSE  I reviewed the triage vital signs and the nursing notes.  Differential diagnosis includes, but is not limited to, ACS, PTX, PNA, muscle strain/spasm, PE, dissection, anxiety, pleural effusion  {Patient presents with symptoms of an acute illness or injury that is potentially  life-threatening.  Patient presents with signs of unstable angina requiring medical admission due to poor follow-up options.  EKG is nonischemic and she is 2 negative troponins.  Chest pressure is present on presentation and resolved with nitroglycerin  administration.  She had taken 2 aspirin  prior to arrival and we provide 2 more here in the ED.  Hypokalemia is replaced orally.  Low BNP and clear CXR.  Consult medicine for admission.  Clinical Course as of 07/03/24 2141  Sun Jul 03, 2024  1859 Reassessed, chest pain has improved dramatically and down to 1/10 after nitroglycerin  [DS]  2131 Reassessed.  Chest pain remains improved without recurrence.  Discussed plan of care and I recommend admission considering her poor ability to follow-up with cardiology as an outpatient and she is agreeable. [DS]  2141 Consulted medicine who agrees to admit [DS]    Clinical Course User Index [DS] Claudene Rover, MD     FINAL CLINICAL IMPRESSION(S) / ED DIAGNOSES   Final diagnoses:  Unstable angina (HCC)     Rx / DC Orders   ED Discharge Orders     None        Note:  This document was prepared using Dragon voice recognition software and may include unintentional dictation errors.   Claudene Rover, MD 07/03/24 2142

## 2024-07-03 NOTE — ED Triage Notes (Signed)
 Patient c/o chest pain that awoke her from her sleep this morning. Reports some SOB  Non compliant with hypertension medicine

## 2024-07-04 ENCOUNTER — Observation Stay

## 2024-07-04 ENCOUNTER — Observation Stay: Admit: 2024-07-04

## 2024-07-04 ENCOUNTER — Observation Stay: Admit: 2024-07-04 | Discharge: 2024-07-04 | Disposition: A | Discharge status: Home / Self Care

## 2024-07-04 DIAGNOSIS — E785 Hyperlipidemia, unspecified: Secondary | ICD-10-CM | POA: Insufficient documentation

## 2024-07-04 DIAGNOSIS — I10 Essential (primary) hypertension: Secondary | ICD-10-CM | POA: Insufficient documentation

## 2024-07-04 DIAGNOSIS — R079 Chest pain, unspecified: Secondary | ICD-10-CM | POA: Diagnosis not present

## 2024-07-04 LAB — LIPID PANEL
Cholesterol: 192 mg/dL (ref 0–200)
HDL: 54 mg/dL (ref >40)
LDL Cholesterol: 124 mg/dL — ABNORMAL HIGH (ref 0–99)
Total CHOL/HDL Ratio: 3.6 ratio
Triglycerides: 71 mg/dL (ref <150)
VLDL: 14 mg/dL (ref 0–40)

## 2024-07-04 LAB — HIV ANTIBODY (ROUTINE TESTING W REFLEX): HIV Screen 4th Generation wRfx: NONREACTIVE

## 2024-07-04 MED ORDER — LOSARTAN POTASSIUM 50 MG PO TABS: 75.0000 mg | ORAL_TABLET | Freq: Every day | ORAL | Status: DC

## 2024-07-04 MED ORDER — HYDRALAZINE HCL 20 MG/ML IJ SOLN: 10.0000 mg | Freq: Once | INTRAMUSCULAR | Status: DC

## 2024-07-04 MED ORDER — ATORVASTATIN CALCIUM 20 MG PO TABS
40.0000 mg | ORAL_TABLET | Freq: Every day | ORAL | Status: DC
  Administered 2024-07-05: 40 mg via ORAL
  Filled 2024-07-04: qty 2

## 2024-07-04 MED ORDER — LOSARTAN POTASSIUM 50 MG PO TABS
50.0000 mg | ORAL_TABLET | Freq: Every day | ORAL | Status: DC
  Administered 2024-07-04: 50 mg via ORAL
  Filled 2024-07-04: qty 1

## 2024-07-04 MED ORDER — METOPROLOL SUCCINATE ER 25 MG PO TB24
25.0000 mg | ORAL_TABLET | Freq: Every day | ORAL | Status: DC
  Administered 2024-07-04: 25 mg via ORAL
  Filled 2024-07-04: qty 1

## 2024-07-04 MED ORDER — LOSARTAN POTASSIUM 50 MG PO TABS: 50.0000 mg | ORAL_TABLET | Freq: Every day | ORAL | Status: DC

## 2024-07-04 MED ORDER — TECHNETIUM TC 99M TETROFOSMIN IV KIT
10.0000 | PACK | Freq: Once | INTRAVENOUS | Status: AC | PRN
  Administered 2024-07-04: 9.99 via INTRAVENOUS

## 2024-07-04 MED ORDER — METOPROLOL TARTRATE 5 MG/5ML IV SOLN: 5.0000 mg | Freq: Once | INTRAVENOUS | Status: DC

## 2024-07-04 NOTE — Consult Note (Signed)
 [facility] CLINIC CARDIOLOGY CONSULT NOTE       Patient ID: ZERIYAH WAIN MRN: [id number] DOB/AGE: 1961-03-14 62 y.o.  Admit date: 07/03/2024 Referring Physician Dr. Madison Peaches Primary Physician Patient, No Pcp Per Primary Cardiologist Dr. Ammon (2020) Reason for Consultation chest pain  HPI: Teresa Glass is a 63 y.o. female  with a past medical history of hypertension, hyperlipidemia who presented to the ED on 07/03/2024 for right sided chest pain that awoke her from sleep.  Chest pain migrated to midsternal area and described as tightness/pressure.  Patient states she works out 2 times a week consistently and does not have any chest pain or SOB.  However after the onset of her chest pain she noticed that chest pain got worse while cleaning the house, pain relieved with rest.  Patient denies associated diaphoresis, N/V, SOB.  Cardiology was consulted for further evaluation of chest pain.  Work up in the ED notable for sodium 142, potassium 3.2, creatinine 0.86, hemoglobin 14.6, platelets 422.  EKG with sinus rhythm, rate 90 bpm with nonspecific ST-T wave changes.  Troponins negative x 2.  BNP negative.  Chest x-ray with no acute cardiopulmonary disease.  Patient was given 1x aspirin  162 mg.  At the time of my evaluation this AM, patient was resting comfortably in hospital bed.  We discussed patient symptoms in further detail.  Patient states she woke from sleep with sharp chest pain on right side of chest.  Patient states that chest pain then moved to her mid sternal area and then described as a tight/pressure.  Patient states she fell back asleep and her chest pain improved.  Patient states when she was cleaning her friend's house it caused her chest pain to worsen.  However patient states that she exercises at least 2 times a week and reports she never has chest pain or shortness of breath with exercise.  Currently patient states her chest pain has much improved and she would rated at 2/10  currently.  Patient denies any SOB, palpitations, lightheadedness or dizziness.  States this morning she just feels very tired.  Patient denies history of CAD or HF.  Patient states the only cardiac history she has is hypertension and reports she stopped taking her blood pressure medications 3 years ago and has not been on any medications.  Review of systems complete and found to be negative unless listed above    Past Medical History:  Diagnosis Date   Diverticulitis     History reviewed. No pertinent surgical history.  Medications Prior to Admission  Medication Sig Dispense Refill Last Dose/Taking   acetaminophen  (TYLENOL ) 500 MG tablet Take 500 mg by mouth every 6 (six) hours as needed. For pain   Unknown   aspirin  81 MG chewable tablet Chew 81 mg by mouth daily as needed for mild pain (pain score 1-3) or headache.   Unknown   ibuprofen (ADVIL,MOTRIN) 200 MG tablet Take 400 mg by mouth every 6 (six) hours as needed for headache or moderate pain (pain score 4-6). For pain   Unknown   atorvastatin  (LIPITOR) 20 MG tablet Take 1 tablet (20 mg total) by mouth daily. 30 tablet 1    hydrochlorothiazide  (MICROZIDE ) 12.5 MG capsule Take 1 capsule (12.5 mg total) by mouth daily. 30 capsule 1    losartan  (COZAAR ) 50 MG tablet Take 1 tablet (50 mg total) by mouth daily. 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   Sexual activity: Not on  file  Other Topics Concern   Not on file  Social History Narrative   Not on file   Social Drivers of Health   Financial Resource Strain: Not on file  Food Insecurity: No Food Insecurity (07/04/2024)   Hunger Vital Sign    Worried About Running Out of Food in the Last Year: Never true    Ran Out of Food in the Last Year: Never true  Transportation Needs: No Transportation Needs (07/04/2024)   PRAPARE - Administrator, Civil Service (Medical): No    Lack of Transportation (Non-Medical): No  Physical Activity: Not on file  Stress: Not on file  Social Connections: Unknown (07/04/2024)   Social Connection and Isolation Panel    Frequency of Communication with Friends and Family: Three times a week    Frequency of Social Gatherings with Friends and Family: Twice a week    Attends Religious Services: Never    Database administrator or Organizations: No    Attends Banker Meetings: Never    Marital Status: Patient declined  Catering manager Violence: Not At Risk (07/04/2024)   Humiliation, Afraid, Rape, and Kick questionnaire    Fear of Current or Ex-Partner: No    Emotionally Abused: No    Physically Abused: No    Sexually Abused: No    Family History  Problem Relation Age of Onset   Cancer Father      Vitals:   07/03/24 2358 07/04/24 0359 07/04/24 0732 07/04/24 1227  BP: 136/71 (!) 145/78 (!) 159/79 (!) 188/96  Pulse: 89 88 91   Resp: 16 18 19 18   Temp: 98.1 F (36.7 C) 98.2 F (36.8 C) 98.4 F (36.9 C) 98.5 F (36.9 C)  TempSrc: Oral  Oral Oral  SpO2: 97% 94% 96%   Weight:      Height:        PHYSICAL EXAM General: Well-appearing female, well nourished, in no acute distress. HEENT: Normocephalic and atraumatic. Neck: No JVD.   Lungs: Normal respiratory effort on room air. Clear bilaterally to auscultation. No wheezes, crackles, rhonchi.  Heart: HRRR. Normal S1 and S2 without gallops or murmurs.  Abdomen: Non-distended appearing.  Msk:  Normal strength and tone for age. Extremities: Warm and well perfused. No clubbing, cyanosis, edema.  Neuro: Alert and oriented X 3. Psych: Answers questions appropriately.   Labs: Basic Metabolic Panel: Recent Labs    07/03/24 1834  NA 142  K 3.2*  CL 107  CO2 26  GLUCOSE 89  BUN 18  CREATININE 0.86  CALCIUM  8.4*   Liver Function Tests: Recent Labs    07/03/24 1834  AST 21  ALT 18  ALKPHOS 71  BILITOT 0.7  PROT 6.1*  ALBUMIN 3.1*   No results for input(s): LIPASE, AMYLASE  in the last 72 hours. CBC: Recent Labs    07/03/24 1732  WBC 18.9*  HGB 14.6  HCT 45.0  MCV 82.9  PLT 422*   Cardiac Enzymes: Recent Labs    07/03/24 1834 07/03/24 2022  TROPONINIHS 10 11   BNP: Recent Labs    07/03/24 1732  BNP 34.8   D-Dimer: No results for input(s): DDIMER in the last 72 hours. Hemoglobin A1C: No results for input(s): HGBA1C in the last 72 hours. Fasting Lipid Panel: No results for input(s): CHOL, HDL, LDLCALC, TRIG, CHOLHDL, LDLDIRECT in the last 72 hours. Thyroid Function Tests: No results for input(s): TSH, T4TOTAL, T3FREE, THYROIDAB in the last 72 hours.  Invalid input(s): FREET3 Anemia Panel: No results for input(s): VITAMINB12, FOLATE, FERRITIN, TIBC, IRON, RETICCTPCT in the last 72 hours.   Radiology: DG Chest 2 View Result Date: 07/03/2024 CLINICAL DATA:  Chest pain. EXAM: CHEST - 2 VIEW COMPARISON:  Radiograph and CT 04/15/2019 FINDINGS: The cardiomediastinal contours are stable. Aortic atherosclerosis. The lungs are clear. Pulmonary vasculature is normal. No consolidation, pleural effusion, or pneumothorax. Exaggerated upper thoracic kyphosis. No acute osseous abnormalities are seen. IMPRESSION: No active cardiopulmonary disease. Electronically Signed   By: Andrea Gasman M.D.   On: 07/03/2024 18:09    ECHO pending  TELEMETRY reviewed by me 07/04/2024: Sinus rhythm rate 90s  EKG reviewed by me: sinus  rhythm, rate 90 bpm with nonspecific ST-T wave changes.   Data reviewed by me 07/04/2024: last 24h vitals tele labs imaging I/O ED provider note, admission H&P.  Principal Problem:   Chest pain Active Problems:   Essential hypertension   Dyslipidemia    ASSESSMENT AND PLAN:  Teresa Glass is a 63 y.o. female  with a past medical history of hypertension, hyperlipidemia who presented to the ED on 07/03/2024 for right sided chest pain that awoke her from sleep.  Chest pain migrated to midsternal area and described as tightness/pressure.  Patient states she works out 2 times a week consistently and does not have any chest pain or SOB.  However after the onset of her chest pain she noticed that chest pain got worse while cleaning the house, pain relieved with rest. Cardiology was consulted for further evaluation.  # Chest Pain # Hypertension # Hyperlipidemia EKG with sinus rhythm, rate 90 bpm with nonspecific ST-T wave changes.  Troponins negative x 2.  BNP negative.  Patient states chest pressure has improved since admission, 2/10. -Plan for cardiac stress test tomorrow.  Patient was supposed to have cardiac stress test today however patient's BP was uncontrolled.  Patient states she felt very anxious about the test and couldn't relax. NPO at midnight.   - Plan to give some antianxiety medication prior to cardiac stress test tomorrow. -Lipid panel ordered. -Continue aspirin  81 mg, atorvastatin  20 mg daily. -Increased losartan  to 75 mg daily. -Ordered metoprolol  succinate 25 mg daily.   This patient's plan of care was discussed and created with Dr. Ammon and he is in agreement.  Signed: Dorene Comfort, PA-C  07/04/2024, 3:05 PM [facility] Cardiology

## 2024-07-04 NOTE — H&P (Addendum)
 [place]   PATIENT NAME: Teresa Glass    MR#:  [id number]  DATE OF BIRTH:  31-Mar-1961  DATE OF ADMISSION:  07/03/2024  PRIMARY CARE PHYSICIAN: Patient, No Pcp Per   Patient is coming from: Home  REQUESTING/REFERRING PHYSICIAN: Sharps Rover, MD  CHIEF COMPLAINT:   Chief Complaint  Patient presents with   Chest Pain    HISTORY OF PRESENT ILLNESS:  Teresa Glass is a 63 y.o. Caucasian female with medical history significant for essential hypertension, dyslipidemia and diverticulitis, who presented to the emergency room with acute onset of chest pain that started in the right side this morning and later was midsternal and graded 7/10 in severity with a feeling of pressure and tightness as if somebody sitting on her chest.  The patient denied any associated nausea or vomiting or diaphoresis or any radiation.  She admitted to associated dyspnea and palpitations though.  No fever or chills.  No cough or wheezing or hemoptysis.  No leg pain or edema or recent travels or surgeries.  No bleeding diathesis.  ED Course: When she came to the ER, BP was 142/76 with otherwise normal vital signs.  Labs reviewed hypokalemia 3.2, albumin 3.1 and total protein of 6.1, with otherwise unremarkable CMP.  BNP was 34.8.  High sensitive troponin I was 10 and later 11.  CBC showed leukocytosis of 18.9 and thrombocytosis of 422. EKG as reviewed by me :  EKG showed normal sinus rhythm with a rate of 90 with nonspecific ST and T wave abnormalities and prolonged QT interval with QTc of 496 MS. Imaging: 2 view chest x-ray showed no acute cardiopulmonary disease.  The patient was given 40 mill equivalent p.o. potassium chloride .  The patient will be admitted to a cardiac telemetry observation bed for further evaluation and management. PAST MEDICAL HISTORY:   Past Medical History:  Diagnosis Date   Diverticulitis   -Essential hypertension - Dyslipidemia -Gastritis  PAST SURGICAL HISTORY:  History  reviewed. No pertinent surgical history. She denies any previous surgeries. SOCIAL HISTORY:   Social History   Tobacco Use   Smoking status: Every Day    Current packs/day: 1.00    Average packs/day: 1 pack/day for 40.0 years (40.0 ttl pk-yrs)    Types: Cigarettes   Smokeless tobacco: Never  Substance Use Topics   Alcohol use: Yes    Comment: rare    FAMILY HISTORY:   Family History  Problem Relation Age of Onset   Cancer Father     DRUG ALLERGIES:  No Known Allergies  REVIEW OF SYSTEMS:   ROS As per history of present illness. All pertinent systems were reviewed above. Constitutional, HEENT, cardiovascular, respiratory, GI, GU, musculoskeletal, neuro, psychiatric, endocrine, integumentary and hematologic systems were reviewed and are otherwise negative/unremarkable except for positive findings mentioned above in the HPI.   MEDICATIONS AT HOME:   Prior to Admission medications   Medication Sig Start Date End Date Taking? Authorizing Provider  acetaminophen  (TYLENOL ) 500 MG tablet Take 500 mg by mouth every 6 (six) hours as needed. For pain   Yes [provider]  aspirin  81 MG chewable tablet Chew 81 mg by mouth daily as needed for mild pain (pain score 1-3) or headache.   Yes [provider]  ibuprofen (ADVIL,MOTRIN) 200 MG tablet Take 400 mg by mouth every 6 (six) hours as needed for headache or moderate pain (pain score 4-6). For pain   Yes [provider]  atorvastatin  (LIPITOR) 20  MG tablet Take 1 tablet (20 mg total) by mouth daily. 04/16/19 06/15/19  Sainani, Vivek J, MD  hydrochlorothiazide  (MICROZIDE ) 12.5 MG capsule Take 1 capsule (12.5 mg total) by mouth daily. 04/17/19 06/16/19  Sainani, Vivek J, MD  losartan  (COZAAR ) 50 MG tablet Take 1 tablet (50 mg total) by mouth daily. 04/17/19 06/16/19  Runell Redia PARAS, MD      VITAL SIGNS:  Blood pressure 136/71, pulse 89, temperature 98.1 F (36.7 C), temperature source Oral, resp. rate 16, height 5'  7 (1.702 m), weight 73 kg, SpO2 97%.  PHYSICAL EXAMINATION:  Physical Exam  GENERAL:  63 y.o.-year-old Caucasian female patient lying in the bed with no acute distress.  EYES: Pupils equal, round, reactive to light and accommodation. No scleral icterus. Extraocular muscles intact.  HEENT: Head atraumatic, normocephalic. Oropharynx and nasopharynx clear.  NECK:  Supple, no jugular venous distention. No thyroid enlargement, no tenderness.  LUNGS: Normal breath sounds bilaterally, no wheezing, rales,rhonchi or crepitation. No use of accessory muscles of respiration.  CARDIOVASCULAR: Regular rate and rhythm, S1, S2 normal. No murmurs, rubs, or gallops.  ABDOMEN: Soft, nondistended, nontender. Bowel sounds present. No organomegaly or mass.  EXTREMITIES: No pedal edema, cyanosis, or clubbing.  NEUROLOGIC: Cranial nerves II through XII are intact. Muscle strength 5/5 in all extremities. Sensation intact. Gait not checked.  PSYCHIATRIC: The patient is alert and oriented x 3.  Normal affect and good eye contact. SKIN: No obvious rash, lesion, or ulcer.   LABORATORY PANEL:   CBC Recent Labs  Lab 07/03/24 1732  WBC 18.9*  HGB 14.6  HCT 45.0  PLT 422*   ------------------------------------------------------------------------------------------------------------------  Chemistries  Recent Labs  Lab 07/03/24 1834  NA 142  K 3.2*  CL 107  CO2 26  GLUCOSE 89  BUN 18  CREATININE 0.86  CALCIUM  8.4*  AST 21  ALT 18  ALKPHOS 71  BILITOT 0.7   ------------------------------------------------------------------------------------------------------------------  Cardiac Enzymes No results for input(s): TROPONINI in the last 168 hours. ------------------------------------------------------------------------------------------------------------------  RADIOLOGY:  DG Chest 2 View Result Date: 07/03/2024 CLINICAL DATA:  Chest pain. EXAM: CHEST - 2 VIEW COMPARISON:  Radiograph and CT  04/15/2019 FINDINGS: The cardiomediastinal contours are stable. Aortic atherosclerosis. The lungs are clear. Pulmonary vasculature is normal. No consolidation, pleural effusion, or pneumothorax. Exaggerated upper thoracic kyphosis. No acute osseous abnormalities are seen. IMPRESSION: No active cardiopulmonary disease. Electronically Signed   By: Andrea Gasman M.D.   On: 07/03/2024 18:09      IMPRESSION AND PLAN:  Assessment and Plan: * Chest pain - The patient will be admitted to an observation cardiac telemetry bed. - Will follow serial troponins and EKGs. - The patient will be placed on aspirin  as well as p.r.n. sublingual nitroglycerin  and morphine  sulfate for pain. - We will obtain a cardiology consult in a.m. for further cardiac risk stratification. - I notified Dr. Florencio about the patient.   Dyslipidemia - Will continue statin therapy.  Essential hypertension - Will continue antihypertensive therapy.       DVT prophylaxis: Lovenox .  Advanced Care Planning:  Code Status: full code.  Family Communication:  The plan of care was discussed in details with the patient (and family). I answered all questions. The patient agreed to proceed with the above mentioned plan. Further management will depend upon hospital course. Disposition Plan: Back to previous home environment Consults called: Cardiology All the records are reviewed and case discussed with ED provider.  Status is: Observation  I certify that at the time  of admission, it is my clinical judgment that the patient will require hospital care extending less than 2 midnights.                            Dispo: The patient is from: Home              Anticipated d/c is to: Home              Patient currently is not medically stable to d/c.              Difficult to place patient: No  Madison DELENA Peaches M.D on 07/04/2024 at 2:10 AM  [facility]   From 7 PM-7 AM, contact night-coverage [email or web address]  CC: Primary  care physician; Patient, No Pcp Per

## 2024-07-04 NOTE — Assessment & Plan Note (Signed)
Will continue antihypertensive therapy.

## 2024-07-04 NOTE — Care Management Obs Status (Signed)
 MEDICARE OBSERVATION STATUS NOTIFICATION   Patient Details  Name: Teresa Glass MRN: [id number] Date of Birth: [birth year]   Medicare Observation Status Notification Given:  Yes    Rojelio SHAUNNA Rattler 07/04/2024, 12:27 PM

## 2024-07-04 NOTE — Progress Notes (Signed)
*  PRELIMINARY RESULTS* Echocardiogram 2D Echocardiogram has been performed.  Floydene Harder 07/04/2024, 2:31 PM

## 2024-07-04 NOTE — Assessment & Plan Note (Signed)
 Cardiology was consulted due to EKG changes.  Troponin remain negative.  Stress testing was negative for any significant ischemia.  Lipid panel with mildly elevated triglyceride and LDL of 100 with goal less than 70. She was started on aspirin and Crestor.

## 2024-07-04 NOTE — Progress Notes (Signed)
*  PRELIMINARY RESULTS* Echocardiogram 2D Echocardiogram has been performed.  Floydene Harder 07/04/2024, 2:30 PM

## 2024-07-04 NOTE — Plan of Care (Signed)
  Problem: Cardiac: Goal: Ability to achieve and maintain adequate cardiovascular perfusion will improve Outcome: Progressing   Problem: Clinical Measurements: Goal: Ability to maintain clinical measurements within normal limits will improve Outcome: Progressing   Problem: Clinical Measurements: Goal: Cardiovascular complication will be avoided Outcome: Progressing   Problem: Pain Managment: Goal: General experience of comfort will improve and/or be controlled Outcome: Progressing

## 2024-07-04 NOTE — Assessment & Plan Note (Signed)
 Will continue statin therapy

## 2024-07-04 NOTE — Progress Notes (Addendum)
 PROGRESS NOTE  Teresa Glass FMW:[id number] DOB: Dec 19, 1960 DOA: 07/03/2024 PCP: Patient, No Pcp Per   LOS: 0 days   Brief narrative:  Teresa Glass is a 63 y.o. Caucasian female with past medical history significant for essential hypertension, dyslipidemia and diverticulitis,  presented to the hospital with acute onset of right-sided chest pain later was midsternal and graded 7/10 in severity with a feeling of pressure and tightness as if somebody sitting on her chest.  In the ED blood pressure was slightly elevated.  Labs were remarkable for potassium slightly low at 3.2.  Albumin 3.1 and total protein of 6.1, with otherwise unremarkable CMP.  BNP was 34.8.  High sensitive troponin I was 10 and later 11.  CBC showed leukocytosis of 18.9 and thrombocytosis of 422.  EKG showed normal sinus rhythm with nonspecific ST and T wave abnormalities.  Chest x-ray was unremarkable.  Patient was given oral potassium and was placed in the hospital for observation.      Assessment/Plan: Principal Problem:   Chest pain Active Problems:   Essential hypertension   Dyslipidemia   Chest pain EKG unremarkable.  Troponins negative.  On aspirin  and sublingual nitroglycerin .  Cardiology has been consulted.  Check 2D echocardiogram.  Plan for stress test today was canceled due to accelerated blood pressure and anxiety.  Patient still complains of chest pressure.   Dyslipidemia On Lipitor   Essential hypertension On losartan   Hypokalemia.  Potassium was 3.2 today.  Will replace orally.  Check levels in AM.  Anxiety.  Not on medication at home has been put on Xanax  here.  Might need 1 dose of IV Ativan  prior to stress test tomorrow   DVT prophylaxis: enoxaparin  (LOVENOX ) injection 40 mg Start: 07/03/24 2200   Disposition: Home likely 07/05/2024  Status is: Observation The patient will require care spanning > 2 midnights and should be moved to inpatient because: Need for stress test 07/05/2024    Code  Status:     Code Status: Full Code  Family Communication: None  Consultants: Cardiology  Procedures: 2D echocardiogram  Anti-infectives:  None  Anti-infectives (From admission, onward)    None        Subjective: Today, patient was seen and examined at bedside.  Stress test was canceled today due to accelerated blood pressure.  Patient still complains of chest pressure but denies dyspnea or shortness of breath.  Objective: Vitals:   07/04/24 0732 07/04/24 1227  BP: (!) 159/79 (!) 188/96  Pulse: 91   Resp: 19 18  Temp: 98.4 F (36.9 C) 98.5 F (36.9 C)  SpO2: 96%    No intake or output data in the 24 hours ending 07/04/24 1416 Filed Weights   07/03/24 1708  Weight: 73 kg   Body mass index is 25.22 kg/m.   Physical Exam: GENERAL: Patient is alert awake and oriented. Not in obvious distress.  Mildly anxious, HENT: No scleral pallor or icterus. Pupils equally reactive to light. Oral mucosa is moist NECK: is supple, no gross swelling noted. CHEST: Clear to auscultation. No crackles or wheezes.   CVS: S1 and S2 heard, no murmur. Regular rate and rhythm.  ABDOMEN: Soft, non-tender, bowel sounds are present. EXTREMITIES: No edema. CNS: Cranial nerves are intact. No focal motor deficits. SKIN: warm and dry without rashes.  Data Review: I have personally reviewed the following laboratory data and studies,  CBC: Recent Labs  Lab 07/03/24 1732  WBC 18.9*  HGB 14.6  HCT 45.0  MCV 82.9  PLT 422*   Basic Metabolic Panel: Recent Labs  Lab 07/03/24 1834  NA 142  K 3.2*  CL 107  CO2 26  GLUCOSE 89  BUN 18  CREATININE 0.86  CALCIUM  8.4*   Liver Function Tests: Recent Labs  Lab 07/03/24 1834  AST 21  ALT 18  ALKPHOS 71  BILITOT 0.7  PROT 6.1*  ALBUMIN 3.1*   No results for input(s): LIPASE, AMYLASE in the last 168 hours. No results for input(s): AMMONIA in the last 168 hours. Cardiac Enzymes: No results for input(s): CKTOTAL, CKMB,  CKMBINDEX, TROPONINI in the last 168 hours. BNP (last 3 results) Recent Labs    07/03/24 1732  BNP 34.8    ProBNP (last 3 results) No results for input(s): PROBNP in the last 8760 hours.  CBG: No results for input(s): GLUCAP in the last 168 hours. No results found for this or any previous visit (from the past 240 hours).   Studies: DG Chest 2 View Result Date: 07/03/2024 CLINICAL DATA:  Chest pain. EXAM: CHEST - 2 VIEW COMPARISON:  Radiograph and CT 04/15/2019 FINDINGS: The cardiomediastinal contours are stable. Aortic atherosclerosis. The lungs are clear. Pulmonary vasculature is normal. No consolidation, pleural effusion, or pneumothorax. Exaggerated upper thoracic kyphosis. No acute osseous abnormalities are seen. IMPRESSION: No active cardiopulmonary disease. Electronically Signed   By: Andrea Gasman M.D.   On: 07/03/2024 18:09      Vernal Alstrom, MD  [facility] 07/04/2024  If 7PM-7AM, please contact night-coverage

## 2024-07-05 DIAGNOSIS — R079 Chest pain, unspecified: Secondary | ICD-10-CM | POA: Diagnosis not present

## 2024-07-05 LAB — CBC
HCT: 43.9 % (ref 36.0–46.0)
Hemoglobin: 14.2 g/dL (ref 12.0–15.0)
MCH: 27 pg (ref 26.0–34.0)
MCHC: 32.3 g/dL (ref 30.0–36.0)
MCV: 83.5 fL (ref 80.0–100.0)
Platelets: 423 K/uL — ABNORMAL HIGH (ref 150–400)
RBC: 5.26 MIL/uL — ABNORMAL HIGH (ref 3.87–5.11)
RDW: 14.6 % (ref 11.5–15.5)
WBC: 12.3 K/uL — ABNORMAL HIGH (ref 4.0–10.5)
nRBC: 0 % (ref 0.0–0.2)

## 2024-07-05 LAB — ECHOCARDIOGRAM COMPLETE
AR max vel: 2.68 cm2
AV Area VTI: 3.11 cm2
AV Area mean vel: 2.65 cm2
AV Mean grad: 2.5 mmHg
AV Peak grad: 3.9 mmHg
Ao pk vel: 0.99 m/s
Area-P 1/2: 2.67 cm2
Height: 67 in
MV VTI: 2.25 cm2
S' Lateral: 2.5 cm
Weight: 2576 [oz_av]

## 2024-07-05 LAB — BASIC METABOLIC PANEL WITH GFR
Anion gap: 13 (ref 5–15)
BUN: 21 mg/dL (ref 8–23)
CO2: 22 mmol/L (ref 22–32)
Calcium: 9 mg/dL (ref 8.9–10.3)
Chloride: 105 mmol/L (ref 98–111)
Creatinine, Ser: 0.85 mg/dL (ref 0.44–1.00)
GFR, Estimated: >60 mL/min (ref >60)
Glucose, Bld: 99 mg/dL (ref 70–99)
Potassium: 3.8 mmol/L (ref 3.5–5.1)
Sodium: 140 mmol/L (ref 135–145)

## 2024-07-05 LAB — NM MYOCAR MULTI W/SPECT W/WALL MOTION / EF
Base ST Depression (mm): 0 mm
Estimated workload: 1
Exercise duration (min): 1 min
Exercise duration (sec): 0 s
LV dias vol: 91 mL (ref 46–106)
LV sys vol: 24 mL (ref 3.8–5.2)
MPHR: 156 {beats}/min
Nuc Stress EF: 74 %
Peak HR: 86 {beats}/min
Percent HR: 55 %
Rest HR: 74 {beats}/min
Rest Nuclear Isotope Dose: 10 mCi
SDS: 3
SRS: 2
SSS: 0
ST Depression (mm): 0 mm
Stress Nuclear Isotope Dose: 30.4 mCi
TID: 1.02

## 2024-07-05 MED ORDER — LABETALOL HCL 5 MG/ML IV SOLN
20.0000 mg | INTRAVENOUS | Status: DC | PRN
  Administered 2024-07-05: 20 mg via INTRAVENOUS
  Filled 2024-07-05 (×2): qty 4

## 2024-07-05 MED ORDER — METOPROLOL SUCCINATE ER 50 MG PO TB24: 50.0000 mg | ORAL_TABLET | Freq: Every day | ORAL | 2 refills | Status: AC

## 2024-07-05 MED ORDER — LOSARTAN POTASSIUM 50 MG PO TABS
100.0000 mg | ORAL_TABLET | Freq: Every day | ORAL | Status: DC
  Administered 2024-07-05: 100 mg via ORAL
  Filled 2024-07-05: qty 2

## 2024-07-05 MED ORDER — ATORVASTATIN CALCIUM 20 MG PO TABS: 20.0000 mg | ORAL_TABLET | Freq: Every day | ORAL | 2 refills | Status: AC

## 2024-07-05 MED ORDER — LOSARTAN POTASSIUM 100 MG PO TABS: 100.0000 mg | ORAL_TABLET | Freq: Every day | ORAL | 2 refills | Status: AC

## 2024-07-05 MED ORDER — HYDRALAZINE HCL 20 MG/ML IJ SOLN
10.0000 mg | Freq: Four times a day (QID) | INTRAMUSCULAR | Status: DC | PRN
  Administered 2024-07-05 (×2): 10 mg via INTRAVENOUS
  Filled 2024-07-05 (×2): qty 1

## 2024-07-05 MED ORDER — AMLODIPINE BESYLATE 5 MG PO TABS: 5.0000 mg | ORAL_TABLET | Freq: Every day | ORAL | 2 refills | Status: AC

## 2024-07-05 MED ORDER — REGADENOSON 0.4 MG/5ML IV SOLN
0.4000 mg | Freq: Once | INTRAVENOUS | Status: AC
  Administered 2024-07-05: 0.4 mg via INTRAVENOUS
  Filled 2024-07-05: qty 5

## 2024-07-05 MED ORDER — METOPROLOL SUCCINATE ER 50 MG PO TB24
50.0000 mg | ORAL_TABLET | Freq: Every day | ORAL | Status: DC
  Administered 2024-07-05: 50 mg via ORAL
  Filled 2024-07-05: qty 1

## 2024-07-05 MED ORDER — TECHNETIUM TC 99M TETROFOSMIN IV KIT
30.0000 | PACK | Freq: Once | INTRAVENOUS | Status: AC | PRN
  Administered 2024-07-05: 30.44 via INTRAVENOUS

## 2024-07-05 MED ORDER — HYDROXYZINE PAMOATE 25 MG PO CAPS: 25.0000 mg | ORAL_CAPSULE | Freq: Three times a day (TID) | ORAL | 0 refills | Status: AC | PRN

## 2024-07-05 MED ORDER — LORAZEPAM 2 MG/ML IJ SOLN
0.5000 mg | Freq: Once | INTRAMUSCULAR | Status: AC
  Administered 2024-07-05: 0.5 mg via INTRAVENOUS
  Filled 2024-07-05: qty 1

## 2024-07-05 MED ORDER — AMLODIPINE BESYLATE 5 MG PO TABS
5.0000 mg | ORAL_TABLET | Freq: Every day | ORAL | Status: DC
  Administered 2024-07-05: 5 mg via ORAL
  Filled 2024-07-05: qty 1

## 2024-07-05 MED ORDER — NITROGLYCERIN 0.4 MG SL SUBL: 0.4000 mg | SUBLINGUAL_TABLET | SUBLINGUAL | 0 refills | Status: AC | PRN

## 2024-07-05 NOTE — Progress Notes (Signed)
 Pt alert and oriented. Discharge instructions reviewed with pt. Pt verbalized understanding. Pt verbalized understanding of need to schedule/attend follow up appointments. Pt verbalized understanding of need to pick up prescriptions from pharmacy. Pt verbalized understanding of changes to medications. IV removed. IV site WNL. Pt left hospital with family. Pt left keys and mug after discharged. Family called and came back to unit to pick up items. When they came back to unit, they requested a note stating that pt could return to work on Monday. Dr. Sonjia notified and he stated that nurse may write note for pt to return to work on Monday. Nurse made note and gave printed note to pt's family member.

## 2024-07-05 NOTE — Discharge Summary (Signed)
 Physician Discharge Summary  Teresa Glass FMW:[id number] DOB: 22-Sep-1961 DOA: 07/03/2024  PCP: Patient, No Pcp Per  Admit date: 07/03/2024 Discharge date: 07/05/2024  Admitted From: Home  Discharge disposition: home    Recommendations for Outpatient Follow-Up:   Follow up with cardiology in 1 week. Patient might need a left heart catheterization as outpatient which will be decided by cardiology after discharge.   Discharge Diagnosis:   Principal Problem:   Chest pain Active Problems:   Essential hypertension   Dyslipidemia   Discharge Condition: Improved.  Diet recommendation: Low sodium, heart healthy.    Wound care: None.  Code status: Full.   History of Present Illness:   Teresa Glass is a 63 y.o. Caucasian female with past medical history significant for essential hypertension, dyslipidemia and diverticulitis,  presented to the hospital with acute onset of right-sided chest pain later was midsternal and graded 7/10 in severity with a feeling of pressure and tightness as if somebody sitting on her chest.  In the ED blood pressure was slightly elevated.  Labs were remarkable for potassium slightly low at 3.2.  Albumin 3.1 and total protein of 6.1, with otherwise unremarkable CMP.  BNP was 34.8.  High sensitive troponin I was 10 and later 11.  CBC showed leukocytosis of 18.9 and thrombocytosis of 422.  EKG showed normal sinus rhythm with nonspecific ST and T wave abnormalities.  Chest x-ray was unremarkable.  Patient was then admitted to hospital for further observation.  Hospital Course:   Following conditions were addressed during hospitalization as listed below,  Chest pain EKG unremarkable.  Troponins negative.  During hospitalization patient received aspirin  and sublingual nitroglycerin .  Cardiology was consulted.  2D echocardiogram showed LV ejection fraction of 55 to 60% with no regional wall motion abnormality and grade 1 diastolic dysfunction.  Stress test was  recommended by cardiology due to persistent chest pressure and findings were consistent with ischemia but the study was intermediate.  LV perfusion was abnormal with a small defect in the inferior location that is reversible.  Communicated with cardiology about the stress test findings and plan is to continue with antihypertensives on discharge aspirin  and statins with outpatient cardiology follow-up in 1 week for potential left heart catheterization as outpatient.  Communicated with the patient regarding the need for follow-up and compliance with her antihypertensives.    Dyslipidemia On Lipitor   Essential hypertension Blood pressure was elevated during hospitalization.  Patient was on HCTZ and  losartan  as outpatient.  Dose of losartan  was increased to 100 mg daily amlodipine  and metoprolol  were added to the regimen.  Will be discontinued on HCTZ on discharge.   Hypokalemia.  Potassium was replenished and was 3.8 prior to discharge.   Anxiety.  Not on medication at home.  Will prescribe Atarax  for few days on discharge.  Disposition.  At this time, patient is stable for disposition home with outpatient cardiology follow-up  Medical Consultants:   Cardiology  Procedures:    Stress test Subjective:   Today, patient was seen and examined before her stress test.  Denied chest pressure dyspnea shortness of breath.  Discharge Exam:   Vitals:   07/05/24 1258 07/05/24 1435  BP: (!) 183/99 (!) 142/72  Pulse:    Resp:    Temp:    SpO2:     Vitals:   07/05/24 1016 07/05/24 1254 07/05/24 1258 07/05/24 1435  BP: (!) 163/80 (!) 204/90 (!) 183/99 (!) 142/72  Pulse:      Resp:  Temp:      TempSrc:      SpO2:      Weight:      Height:        General: Alert awake, not in obvious distress, mildly anxious HENT: pupils equally reacting to light,  No scleral pallor or icterus noted. Oral mucosa is moist.  Chest:  Clear breath sounds.  Diminished breath sounds bilaterally. No  crackles or wheezes.  CVS: S1 &S2 heard. No murmur.  Regular rate and rhythm. Abdomen: Soft, nontender, nondistended.  Bowel sounds are heard.   Extremities: No cyanosis, clubbing or edema.  Peripheral pulses are palpable. Psych: Alert, awake and oriented, normal mood CNS:  No cranial nerve deficits.  Power equal in all extremities.   Skin: Warm and dry.  No rashes noted.  The results of significant diagnostics from this hospitalization (including imaging, microbiology, ancillary and laboratory) are listed below for reference.     Diagnostic Studies:   NM Myocar Multi W/Spect W/Wall Motion / EF Result Date: 07/05/2024   Findings are consistent with ischemia. The study is intermediate risk.   No ST deviation was noted.   LV perfusion is abnormal. There is evidence of ischemia. Defect 1: There is a small defect with mild reduction in uptake present in the mid inferior location(s) that is reversible. There is normal wall motion in the defect area. Consistent with ischemia.   Left ventricular function is normal. Nuclear stress EF: 74%. The left ventricular ejection fraction is normal (55-65%). End diastolic cavity size is normal. 1.  Small reverse mid inferior defect of mild intensity consistent with mild ischemia, suggest clinical correlation 2.  Normal left ventricular function 3.  Low to intermediate cardiovascular risk   ECHOCARDIOGRAM COMPLETE Result Date: 07/05/2024    ECHOCARDIOGRAM REPORT   Patient Name:   Teresa Glass Date of Exam: 07/04/2024 Medical Rec #:  [id number]    Height:       67.0 in Accession #:    [id number]   Weight:       161.0 lb Date of Birth:  09/01/1961    BSA:          1.844 m Patient Age:    63 years     BP:           159/79 mmHg Patient Gender: F            HR:           91 bpm. Exam Location:  [facility] Procedure: 2D Echo, Color Doppler, Cardiac Doppler and Strain Analysis (Both            Spectral and Color Flow Doppler were utilized during procedure). Indications:     Chest  pain R07.9  History:         Patient has no prior history of Echocardiogram examinations.                  Signs/Symptoms:Chest Pain; Risk Factors:Hypertension.  Sonographer:     Christopher Furnace Referring Phys:  [id number] DORENE COMFORT Diagnosing Phys: Marsa Dooms MD  Sonographer Comments: Global longitudinal strain was attempted. IMPRESSIONS  1. Left ventricular ejection fraction, by estimation, is 55 to 60%. The left ventricle has normal function. The left ventricle has no regional wall motion abnormalities. Left ventricular diastolic parameters are consistent with Grade I diastolic dysfunction (impaired relaxation). The average left ventricular global longitudinal strain is -16.0 %. The global longitudinal strain is normal.  2. Right ventricular systolic function is normal. The  right ventricular size is normal.  3. The mitral valve is normal in structure. Mild mitral valve regurgitation. No evidence of mitral stenosis.  4. The aortic valve is normal in structure. Aortic valve regurgitation is not visualized. No aortic stenosis is present.  5. The inferior vena cava is normal in size with greater than 50% respiratory variability, suggesting right atrial pressure of 3 mmHg. FINDINGS  Left Ventricle: Left ventricular ejection fraction, by estimation, is 55 to 60%. The left ventricle has normal function. The left ventricle has no regional wall motion abnormalities. The average left ventricular global longitudinal strain is -16.0 %. Strain was performed and the global longitudinal strain is normal. The left ventricular internal cavity size was normal in size. There is no left ventricular hypertrophy. Left ventricular diastolic parameters are consistent with Grade I diastolic dysfunction (impaired relaxation). Right Ventricle: The right ventricular size is normal. No increase in right ventricular wall thickness. Right ventricular systolic function is normal. Left Atrium: Left atrial size was normal in size. Right  Atrium: Right atrial size was normal in size. Pericardium: There is no evidence of pericardial effusion. Mitral Valve: The mitral valve is normal in structure. Mild mitral valve regurgitation. No evidence of mitral valve stenosis. MV peak gradient, 5.8 mmHg. The mean mitral valve gradient is 2.0 mmHg. Tricuspid Valve: The tricuspid valve is normal in structure. Tricuspid valve regurgitation is mild . No evidence of tricuspid stenosis. Aortic Valve: The aortic valve is normal in structure. Aortic valve regurgitation is not visualized. No aortic stenosis is present. Aortic valve mean gradient measures 2.5 mmHg. Aortic valve peak gradient measures 3.9 mmHg. Aortic valve area, by VTI measures 3.11 cm. Pulmonic Valve: The pulmonic valve was normal in structure. Pulmonic valve regurgitation is not visualized. No evidence of pulmonic stenosis. Aorta: The aortic root is normal in size and structure. Venous: The inferior vena cava is normal in size with greater than 50% respiratory variability, suggesting right atrial pressure of 3 mmHg. IAS/Shunts: No atrial level shunt detected by color flow Doppler. Additional Comments: 3D was performed not requiring image post processing on an independent workstation and was indeterminate.  LEFT VENTRICLE PLAX 2D LVIDd:         3.40 cm   Diastology LVIDs:         2.50 cm   LV e' medial:    6.96 cm/s LV PW:         0.90 cm   LV E/e' medial:  10.7 LV IVS:        1.30 cm   LV e' lateral:   7.83 cm/s LVOT diam:     2.00 cm   LV E/e' lateral: 9.5 LV SV:         65 LV SV Index:   35        2D Longitudinal Strain LVOT Area:     3.14 cm  2D Strain GLS (A4C):   -18.3 %                          2D Strain GLS (A3C):   -13.0 %                          2D Strain GLS (A2C):   -16.8 %                          2D Strain GLS Avg:     -  16.0 % RIGHT VENTRICLE RV Basal diam:  2.60 cm RV Mid diam:    2.50 cm RV S prime:     10.30 cm/s LEFT ATRIUM             Index        RIGHT ATRIUM           Index LA  diam:        3.50 cm 1.90 cm/m   RA Area:     11.30 cm LA Vol (A2C):   25.5 ml 13.83 ml/m  RA Volume:   21.70 ml  11.77 ml/m LA Vol (A4C):   47.0 ml 25.49 ml/m LA Biplane Vol: 37.3 ml 20.23 ml/m  AORTIC VALVE AV Area (Vmax):    2.68 cm AV Area (Vmean):   2.65 cm AV Area (VTI):     3.11 cm AV Vmax:           99.15 cm/s AV Vmean:          72.500 cm/s AV VTI:            0.208 m AV Peak Grad:      3.9 mmHg AV Mean Grad:      2.5 mmHg LVOT Vmax:         84.60 cm/s LVOT Vmean:        61.100 cm/s LVOT VTI:          0.206 m LVOT/AV VTI ratio: 0.99  AORTA Ao Root diam: 3.00 cm MITRAL VALVE                TRICUSPID VALVE MV Area (PHT): 2.67 cm     TR Peak grad:   10.8 mmHg MV Area VTI:   2.25 cm     TR Vmax:        164.00 cm/s MV Peak grad:  5.8 mmHg MV Mean grad:  2.0 mmHg     SHUNTS MV Vmax:       1.20 m/s     Systemic VTI:  0.21 m MV Vmean:      73.7 cm/s    Systemic Diam: 2.00 cm MV Decel Time: 284 msec MV E velocity: 74.40 cm/s MV A velocity: 112.00 cm/s MV E/A ratio:  0.66 Marsa Dooms MD Electronically signed by Marsa Dooms MD Signature Date/Time: 07/05/2024/1:21:43 PM    Final      Labs:   Basic Metabolic Panel: Recent Labs  Lab 07/03/24 1834 07/05/24 0525  NA 142 140  K 3.2* 3.8  CL 107 105  CO2 26 22  GLUCOSE 89 99  BUN 18 21  CREATININE 0.86 0.85  CALCIUM  8.4* 9.0   GFR Estimated Creatinine Clearance: 65 mL/min (by C-G formula based on SCr of 0.85 mg/dL). Liver Function Tests: Recent Labs  Lab 07/03/24 1834  AST 21  ALT 18  ALKPHOS 71  BILITOT 0.7  PROT 6.1*  ALBUMIN 3.1*   No results for input(s): LIPASE, AMYLASE in the last 168 hours. No results for input(s): AMMONIA in the last 168 hours. Coagulation profile No results for input(s): INR, PROTIME in the last 168 hours.  CBC: Recent Labs  Lab 07/03/24 1732 07/05/24 0525  WBC 18.9* 12.3*  HGB 14.6 14.2  HCT 45.0 43.9  MCV 82.9 83.5  PLT 422* 423*   Cardiac Enzymes: No results for  input(s): CKTOTAL, CKMB, CKMBINDEX, TROPONINI in the last 168 hours. BNP: Invalid input(s): POCBNP CBG: No results for input(s): GLUCAP in the last 168 hours. D-Dimer No results  for input(s): DDIMER in the last 72 hours. Hgb A1c No results for input(s): HGBA1C in the last 72 hours. Lipid Profile Recent Labs    07/03/24 2022  CHOL 192  HDL 54  LDLCALC 124*  TRIG 71  CHOLHDL 3.6   Thyroid function studies No results for input(s): TSH, T4TOTAL, T3FREE, THYROIDAB in the last 72 hours.  Invalid input(s): FREET3 Anemia work up No results for input(s): VITAMINB12, FOLATE, FERRITIN, TIBC, IRON, RETICCTPCT in the last 72 hours. Microbiology No results found for this or any previous visit (from the past 240 hours).   Discharge Instructions:   Discharge Instructions     Ambulatory Referral for Lung Cancer Scre   Complete by: As directed    Call MD for:  difficulty breathing, headache or visual disturbances   Complete by: As directed    Call MD for:  severe uncontrolled pain   Complete by: As directed    Diet - low sodium heart healthy   Complete by: As directed    Discharge instructions   Complete by: As directed    Follow-up with cardiology in 1 week.  Take medications as prescribed.  Seek medical attention for worsening symptoms.   Increase activity slowly   Complete by: As directed       Allergies as of 07/05/2024   No Known Allergies      Medication List     STOP taking these medications    hydrochlorothiazide  12.5 MG capsule Commonly known as: MICROZIDE    ibuprofen 200 MG tablet Commonly known as: ADVIL       TAKE these medications    acetaminophen  500 MG tablet Commonly known as: TYLENOL  Take 500 mg by mouth every 6 (six) hours as needed. For pain   amLODipine  5 MG tablet Commonly known as: NORVASC  Take 1 tablet (5 mg total) by mouth daily. Start taking on: July 06, 2024   aspirin  81 MG chewable  tablet Chew 81 mg by mouth daily as needed for mild pain (pain score 1-3) or headache.   atorvastatin  20 MG tablet Commonly known as: Lipitor Take 1 tablet (20 mg total) by mouth daily.   hydrOXYzine  25 MG capsule Commonly known as: VISTARIL  Take 1 capsule (25 mg total) by mouth 3 (three) times daily as needed for anxiety.   losartan  100 MG tablet Commonly known as: COZAAR  Take 1 tablet (100 mg total) by mouth daily. What changed:  medication strength how much to take   metoprolol  succinate 50 MG 24 hr tablet Commonly known as: TOPROL -XL Take 1 tablet (50 mg total) by mouth daily. Take with or immediately following a meal. Start taking on: July 06, 2024   nitroGLYCERIN  0.4 MG SL tablet Commonly known as: NITROSTAT  Place 1 tablet (0.4 mg total) under the tongue every 5 (five) minutes as needed for chest pain.        Follow-up Information     Paraschos, Alexander, MD. Go in 1 week(s).   Specialty: Cardiology Contact information: [address] [facility] [place] [address] [phone]                  Time coordinating discharge: 39 minutes  Signed:  Jacody Beneke  [facility] 07/05/2024, 3:19 PM

## 2024-07-05 NOTE — Discharge Instructions (Addendum)
 Some PCP options in [place] area- not a comprehensive list  [facility]- [phone] [facility] [phone] [facility]- [phone] [facility]- [phone] [facility]- [phone] Nichole [facility]- [phone] or [facility] Physician Referral Line [phone]             [facility] PCU [address] [place], [place]  [address] Phone:  [phone]   Patient: Teresa Glass  Date of Birth: 27-Sep-1961  Date of Visit: 07/03/2024   To Whom It May Concern:  [name] was seen and treated on 07/03/2024 to   07/05/2024. She may return to work on Monday July 11, 2024.         Sincerely,   Dr. Laxman Pokhrel

## 2024-07-05 NOTE — Plan of Care (Signed)
  Problem: Cardiac: Goal: Ability to achieve and maintain adequate cardiovascular perfusion will improve Outcome: Progressing   Problem: Clinical Measurements: Goal: Ability to maintain clinical measurements within normal limits will improve Outcome: Progressing   Problem: Pain Managment: Goal: General experience of comfort will improve and/or be controlled Outcome: Progressing   Problem: Safety: Goal: Ability to remain free from injury will improve Outcome: Progressing

## 2024-07-05 NOTE — Progress Notes (Signed)
 [facility] CLINIC CARDIOLOGY PROGRESS NOTE       Patient ID: Teresa Glass MRN: [id number] DOB/AGE: October 07, 1961 63 y.o.  Admit date: 07/03/2024 Referring Physician Dr. Madison Peaches Primary Physician Patient, No Pcp Per Primary Cardiologist Dr. Ammon (2020) Reason for Consultation chest pain  HPI: Teresa Glass is a 63 y.o. female  with a past medical history of hypertension, hyperlipidemia who presented to the ED on 07/03/2024 for right sided chest pain that awoke her from sleep.  Chest pain migrated to midsternal area and described as tightness/pressure.  Patient states she works out 2 times a week consistently and does not have any chest pain or SOB.  However after the onset of her chest pain she noticed that chest pain got worse while cleaning the house, pain relieved with rest.  Patient denies associated diaphoresis, N/V, SOB.  Cardiology was consulted for further evaluation of chest pain.  Interval History: -Patient seen and examined this AM and laying comfortably in hospital bed. Patient states she still feels anxious about the stress test and denies any more recurrence of chest pain.  Continues to deny SOB or palpitations..  -Patients BP elevated and HR stable this AM. Overnight Tele showed no significant events.  -Patient remains on room air with stable SpO2.  - Patient underwent cardiac stress test today.  Tolerated procedure well.  Cardiac Stress (07/22)   Findings are consistent with ischemia. The study is intermediate risk.   No ST deviation was noted.   LV perfusion is abnormal. There is evidence of ischemia. Defect 1: There is a small defect with mild reduction in uptake present in the mid inferior location(s) that is reversible. There is normal wall motion in the defect area. Consistent with ischemia.   Left ventricular function is normal. Nuclear stress EF: 74%. The left ventricular ejection fraction is normal (55-65%). End diastolic cavity size is normal.   1.  Small reverse  mid inferior defect of mild intensity consistent with mild ischemia, suggest clinical correlation 2.  Normal left ventricular function 3.  Low to intermediate cardiovascular risk  Review of systems complete and found to be negative unless listed above    Past Medical History:  Diagnosis Date   Diverticulitis     History reviewed. No pertinent surgical history.  Medications Prior to Admission  Medication Sig Dispense Refill Last Dose/Taking   acetaminophen  (TYLENOL ) 500 MG tablet Take 500 mg by mouth every 6 (six) hours as needed. For pain   Unknown   aspirin  81 MG chewable tablet Chew 81 mg by mouth daily as needed for mild pain (pain score 1-3) or headache.   Unknown   ibuprofen (ADVIL,MOTRIN) 200 MG tablet Take 400 mg by mouth every 6 (six) hours as needed for headache or moderate pain (pain score 4-6). For pain   Unknown   atorvastatin  (LIPITOR) 20 MG tablet Take 1 tablet (20 mg total) by mouth daily. 30 tablet 1    hydrochlorothiazide  (MICROZIDE ) 12.5 MG capsule Take 1 capsule (12.5 mg total) by mouth daily. 30 capsule 1    losartan  (COZAAR ) 50 MG tablet Take 1 tablet (50 mg total) by mouth daily. 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   Sexual activity: Not on file  Other Topics Concern   Not on file  Social History Narrative   Not on file   Social Drivers of Health   Financial Resource Strain: Not on file   Food Insecurity: No Food Insecurity (07/04/2024)   Hunger Vital Sign    Worried About Running Out of Food in the Last Year: Never true    Ran Out of Food in the Last Year: Never true  Transportation Needs: No Transportation Needs (07/04/2024)   PRAPARE - Administrator, Civil Service (Medical): No    Lack of Transportation (Non-Medical): No  Physical Activity: Not on file  Stress: Not on file  Social Connections: Unknown (07/04/2024)   Social Connection and Isolation Panel    Frequency of Communication with Friends and Family: Three times a week    Frequency of Social Gatherings with Friends and Family: Twice a week    Attends Religious Services: Never    Database administrator or Organizations: No    Attends Banker Meetings: Never    Marital Status: Patient declined  Catering manager Violence: Not At Risk (07/04/2024)   Humiliation, Afraid, Rape, and Kick questionnaire    Fear of Current or Ex-Partner: No    Emotionally Abused: No    Physically Abused: No    Sexually Abused: No    Family History  Problem Relation Age of Onset   Cancer Father      Vitals:   07/05/24 1016 07/05/24 1254 07/05/24 1258 07/05/24 1435  BP: (!) 163/80 (!) 204/90 (!) 183/99 (!) 142/72  Pulse:      Resp:      Temp:      TempSrc:      SpO2:      Weight:      Height:        PHYSICAL EXAM General: Well-appearing female, well nourished, in no acute distress. HEENT: Normocephalic and atraumatic. Neck: No JVD.   Lungs: Normal respiratory effort on room air. Clear bilaterally to auscultation. No wheezes, crackles, rhonchi.  Heart: HRRR. Normal S1 and S2 without gallops or murmurs.  Abdomen: Non-distended appearing.  Msk: Normal strength and tone for age. Extremities: Warm and well perfused. No clubbing, cyanosis, edema.  Neuro: Alert and oriented X 3. Psych: Answers questions appropriately.   Labs: Basic Metabolic Panel: Recent Labs    07/03/24 1834 07/05/24 0525  NA  142 140  K 3.2* 3.8  CL 107 105  CO2 26 22  GLUCOSE 89 99  BUN 18 21  CREATININE 0.86 0.85  CALCIUM  8.4* 9.0   Liver Function Tests: Recent Labs    07/03/24 1834  AST 21  ALT 18  ALKPHOS 71  BILITOT 0.7  PROT 6.1*  ALBUMIN 3.1*   No results for input(s): LIPASE, AMYLASE in the last 72 hours. CBC: Recent Labs    07/03/24 1732 07/05/24 0525  WBC 18.9* 12.3*  HGB 14.6 14.2  HCT 45.0 43.9  MCV 82.9 83.5  PLT 422* 423*   Cardiac Enzymes: Recent Labs  07/03/24 1834 07/03/24 2022  TROPONINIHS 10 11   BNP: Recent Labs    07/03/24 1732  BNP 34.8   D-Dimer: No results for input(s): DDIMER in the last 72 hours. Hemoglobin A1C: No results for input(s): HGBA1C in the last 72 hours. Fasting Lipid Panel: Recent Labs    07/03/24 2022  CHOL 192  HDL 54  LDLCALC 124*  TRIG 71  CHOLHDL 3.6   Thyroid Function Tests: No results for input(s): TSH, T4TOTAL, T3FREE, THYROIDAB in the last 72 hours.  Invalid input(s): FREET3 Anemia Panel: No results for input(s): VITAMINB12, FOLATE, FERRITIN, TIBC, IRON, RETICCTPCT in the last 72 hours.   Radiology: NM Myocar Multi W/Spect W/Wall Motion / EF Result Date: 07/05/2024   Findings are consistent with ischemia. The study is intermediate risk.   No ST deviation was noted.   LV perfusion is abnormal. There is evidence of ischemia. Defect 1: There is a small defect with mild reduction in uptake present in the mid inferior location(s) that is reversible. There is normal wall motion in the defect area. Consistent with ischemia.   Left ventricular function is normal. Nuclear stress EF: 74%. The left ventricular ejection fraction is normal (55-65%). End diastolic cavity size is normal. 1.  Small reverse mid inferior defect of mild intensity consistent with mild ischemia, suggest clinical correlation 2.  Normal left ventricular function 3.  Low to intermediate cardiovascular risk   ECHOCARDIOGRAM  COMPLETE Result Date: 07/05/2024    ECHOCARDIOGRAM REPORT   Patient Name:   Teresa Glass Date of Exam: 07/04/2024 Medical Rec #:  [id number]    Height:       67.0 in Accession #:    [id number]   Weight:       161.0 lb Date of Birth:  [birth year]    BSA:          1.844 m Patient Age:    63 years     BP:           159/79 mmHg Patient Gender: F            HR:           91 bpm. Exam Location:  [facility] Procedure: 2D Echo, Color Doppler, Cardiac Doppler and Strain Analysis (Both            Spectral and Color Flow Doppler were utilized during procedure). Indications:     Chest pain R07.9  History:         Patient has no prior history of Echocardiogram examinations.                  Signs/Symptoms:Chest Pain; Risk Factors:Hypertension.  Sonographer:     Christopher Furnace Referring Phys:  [id number] DORENE COMFORT Diagnosing Phys: Marsa Dooms MD  Sonographer Comments: Global longitudinal strain was attempted. IMPRESSIONS  1. Left ventricular ejection fraction, by estimation, is 55 to 60%. The left ventricle has normal function. The left ventricle has no regional wall motion abnormalities. Left ventricular diastolic parameters are consistent with Grade I diastolic dysfunction (impaired relaxation). The average left ventricular global longitudinal strain is -16.0 %. The global longitudinal strain is normal.  2. Right ventricular systolic function is normal. The right ventricular size is normal.  3. The mitral valve is normal in structure. Mild mitral valve regurgitation. No evidence of mitral stenosis.  4. The aortic valve is normal in structure. Aortic valve regurgitation is not visualized. No aortic stenosis is present.  5. The inferior vena cava is normal in  size with greater than 50% respiratory variability, suggesting right atrial pressure of 3 mmHg. FINDINGS  Left Ventricle: Left ventricular ejection fraction, by estimation, is 55 to 60%. The left ventricle has normal function. The left ventricle has no regional wall motion  abnormalities. The average left ventricular global longitudinal strain is -16.0 %. Strain was performed and the global longitudinal strain is normal. The left ventricular internal cavity size was normal in size. There is no left ventricular hypertrophy. Left ventricular diastolic parameters are consistent with Grade I diastolic dysfunction (impaired relaxation). Right Ventricle: The right ventricular size is normal. No increase in right ventricular wall thickness. Right ventricular systolic function is normal. Left Atrium: Left atrial size was normal in size. Right Atrium: Right atrial size was normal in size. Pericardium: There is no evidence of pericardial effusion. Mitral Valve: The mitral valve is normal in structure. Mild mitral valve regurgitation. No evidence of mitral valve stenosis. MV peak gradient, 5.8 mmHg. The mean mitral valve gradient is 2.0 mmHg. Tricuspid Valve: The tricuspid valve is normal in structure. Tricuspid valve regurgitation is mild . No evidence of tricuspid stenosis. Aortic Valve: The aortic valve is normal in structure. Aortic valve regurgitation is not visualized. No aortic stenosis is present. Aortic valve mean gradient measures 2.5 mmHg. Aortic valve peak gradient measures 3.9 mmHg. Aortic valve area, by VTI measures 3.11 cm. Pulmonic Valve: The pulmonic valve was normal in structure. Pulmonic valve regurgitation is not visualized. No evidence of pulmonic stenosis. Aorta: The aortic root is normal in size and structure. Venous: The inferior vena cava is normal in size with greater than 50% respiratory variability, suggesting right atrial pressure of 3 mmHg. IAS/Shunts: No atrial level shunt detected by color flow Doppler. Additional Comments: 3D was performed not requiring image post processing on an independent workstation and was indeterminate.  LEFT VENTRICLE PLAX 2D LVIDd:         3.40 cm   Diastology LVIDs:         2.50 cm   LV e' medial:    6.96 cm/s LV PW:         0.90 cm    LV E/e' medial:  10.7 LV IVS:        1.30 cm   LV e' lateral:   7.83 cm/s LVOT diam:     2.00 cm   LV E/e' lateral: 9.5 LV SV:         65 LV SV Index:   35        2D Longitudinal Strain LVOT Area:     3.14 cm  2D Strain GLS (A4C):   -18.3 %                          2D Strain GLS (A3C):   -13.0 %                          2D Strain GLS (A2C):   -16.8 %                          2D Strain GLS Avg:     -16.0 % RIGHT VENTRICLE RV Basal diam:  2.60 cm RV Mid diam:    2.50 cm RV S prime:     10.30 cm/s LEFT ATRIUM             Index        RIGHT  ATRIUM           Index LA diam:        3.50 cm 1.90 cm/m   RA Area:     11.30 cm LA Vol (A2C):   25.5 ml 13.83 ml/m  RA Volume:   21.70 ml  11.77 ml/m LA Vol (A4C):   47.0 ml 25.49 ml/m LA Biplane Vol: 37.3 ml 20.23 ml/m  AORTIC VALVE AV Area (Vmax):    2.68 cm AV Area (Vmean):   2.65 cm AV Area (VTI):     3.11 cm AV Vmax:           99.15 cm/s AV Vmean:          72.500 cm/s AV VTI:            0.208 m AV Peak Grad:      3.9 mmHg AV Mean Grad:      2.5 mmHg LVOT Vmax:         84.60 cm/s LVOT Vmean:        61.100 cm/s LVOT VTI:          0.206 m LVOT/AV VTI ratio: 0.99  AORTA Ao Root diam: 3.00 cm MITRAL VALVE                TRICUSPID VALVE MV Area (PHT): 2.67 cm     TR Peak grad:   10.8 mmHg MV Area VTI:   2.25 cm     TR Vmax:        164.00 cm/s MV Peak grad:  5.8 mmHg MV Mean grad:  2.0 mmHg     SHUNTS MV Vmax:       1.20 m/s     Systemic VTI:  0.21 m MV Vmean:      73.7 cm/s    Systemic Diam: 2.00 cm MV Decel Time: 284 msec MV E velocity: 74.40 cm/s MV A velocity: 112.00 cm/s MV E/A ratio:  0.66 Marsa Dooms MD Electronically signed by Marsa Dooms MD Signature Date/Time: 07/05/2024/1:21:43 PM    Final    DG Chest 2 View Result Date: 07/03/2024 CLINICAL DATA:  Chest pain. EXAM: CHEST - 2 VIEW COMPARISON:  Radiograph and CT 04/15/2019 FINDINGS: The cardiomediastinal contours are stable. Aortic atherosclerosis. The lungs are clear. Pulmonary vasculature is  normal. No consolidation, pleural effusion, or pneumothorax. Exaggerated upper thoracic kyphosis. No acute osseous abnormalities are seen. IMPRESSION: No active cardiopulmonary disease. Electronically Signed   By: Andrea Gasman M.D.   On: 07/03/2024 18:09    ECHO pending  TELEMETRY reviewed by me 07/05/2024: Sinus rhythm rate 90s  EKG reviewed by me: sinus rhythm, rate 90 bpm with nonspecific ST-T wave changes.   Data reviewed by me 07/05/2024: last 24h vitals tele labs imaging I/O hospitalist progress notes.  Principal Problem:   Chest pain Active Problems:   Essential hypertension   Dyslipidemia    ASSESSMENT AND PLAN:  Teresa Glass is a 63 y.o. female  with a past medical history of hypertension, hyperlipidemia who presented to the ED on 07/03/2024 for right sided chest pain that awoke her from sleep.  Chest pain migrated to midsternal area and described as tightness/pressure.  Patient states she works out 2 times a week consistently and does not have any chest pain or SOB.  However after the onset of her chest pain she noticed that chest pain got worse while cleaning the house, pain relieved with rest. Cardiology was consulted for further evaluation.  # Chest Pain #  Hypertension # Hyperlipidemia # Anxiety EKG with sinus rhythm, rate 90 bpm with nonspecific ST-T wave changes.  Troponins negative x 2.  BNP negative.  Patient states chest pressure has improved since admission, 2/10. On 07/23 states she is chest pain-free with no recurrence.  Patient underwent cardiac stress test on 07/22 that showed small reversible mid inferior defect of mild intensity consistent with mild ischemia.  Panel this admission with elevated LDL of 124. -Continue aspirin  81 mg -Increased atorvastatin  to 40 mg daily, due to LDL not at goal of < 70. -Continue losartan  to 100 mg daily. -Continue metoprolol  succinate 50 mg daily. -Ordered amlodipine  5 mg daily. -Recommend discussion of possible outpatient LHC  at outpatient cardiology follow-up, to further evaluate mild ischemia on cardiac stress test.  Ok for discharge today from a cardiac perspective. Will arrange for follow up in clinic with Dr. Ammon in 1-2 weeks.   This patient's plan of care was discussed and created with Dr. Ammon and he is in agreement.  Signed: Dorene Comfort, PA-C  07/05/2024, 2:48 PM [facility] Cardiology

## 2024-07-19 ENCOUNTER — Other Ambulatory Visit: Payer: Self-pay | Admitting: Cardiovascular Disease

## 2024-07-19 DIAGNOSIS — R9439 Abnormal result of other cardiovascular function study: Secondary | ICD-10-CM

## 2024-07-19 DIAGNOSIS — R9431 Abnormal electrocardiogram [ECG] [EKG]: Secondary | ICD-10-CM

## 2024-07-19 DIAGNOSIS — R0789 Other chest pain: Secondary | ICD-10-CM

## 2024-09-05 ENCOUNTER — Encounter: Payer: Self-pay | Admitting: Cardiovascular Disease

## 2024-09-23 ENCOUNTER — Other Ambulatory Visit: Payer: Self-pay | Admitting: Cardiovascular Disease

## 2024-09-23 DIAGNOSIS — R0789 Other chest pain: Secondary | ICD-10-CM

## 2024-09-23 DIAGNOSIS — R9439 Abnormal result of other cardiovascular function study: Secondary | ICD-10-CM

## 2024-09-23 DIAGNOSIS — R9431 Abnormal electrocardiogram [ECG] [EKG]: Secondary | ICD-10-CM
